# Patient Record
Sex: Female | Born: 1935 | Race: Black or African American | Hispanic: No | Marital: Married | Smoking: Never smoker
Health system: Southern US, Community
[De-identification: ages and names within clinical notes are randomized; demographics above are authoritative.]

## PROBLEM LIST (undated history)

## (undated) DIAGNOSIS — I10 Essential (primary) hypertension: Secondary | ICD-10-CM

---

## 2005-10-26 ENCOUNTER — Emergency Department (HOSPITAL_COMMUNITY): Admission: EM | Admit: 2005-10-26 | Discharge: 2005-10-26 | Payer: Self-pay | Admitting: Emergency Medicine

## 2011-08-29 DIAGNOSIS — E559 Vitamin D deficiency, unspecified: Secondary | ICD-10-CM | POA: Diagnosis not present

## 2011-08-29 DIAGNOSIS — E781 Pure hyperglyceridemia: Secondary | ICD-10-CM | POA: Diagnosis not present

## 2011-08-29 DIAGNOSIS — I10 Essential (primary) hypertension: Secondary | ICD-10-CM | POA: Diagnosis not present

## 2011-12-27 DIAGNOSIS — F0390 Unspecified dementia without behavioral disturbance: Secondary | ICD-10-CM | POA: Diagnosis not present

## 2011-12-27 DIAGNOSIS — I10 Essential (primary) hypertension: Secondary | ICD-10-CM | POA: Diagnosis not present

## 2012-03-27 DIAGNOSIS — I10 Essential (primary) hypertension: Secondary | ICD-10-CM | POA: Diagnosis not present

## 2012-04-19 DIAGNOSIS — I10 Essential (primary) hypertension: Secondary | ICD-10-CM | POA: Diagnosis not present

## 2013-01-09 DIAGNOSIS — I10 Essential (primary) hypertension: Secondary | ICD-10-CM | POA: Diagnosis not present

## 2013-01-09 DIAGNOSIS — E7800 Pure hypercholesterolemia, unspecified: Secondary | ICD-10-CM | POA: Diagnosis not present

## 2013-03-20 DIAGNOSIS — Z0000 Encounter for general adult medical examination without abnormal findings: Secondary | ICD-10-CM | POA: Diagnosis not present

## 2013-07-03 DIAGNOSIS — I10 Essential (primary) hypertension: Secondary | ICD-10-CM | POA: Diagnosis not present

## 2014-04-30 DIAGNOSIS — Z23 Encounter for immunization: Secondary | ICD-10-CM | POA: Diagnosis not present

## 2014-04-30 DIAGNOSIS — L218 Other seborrheic dermatitis: Secondary | ICD-10-CM | POA: Diagnosis not present

## 2014-04-30 DIAGNOSIS — I10 Essential (primary) hypertension: Secondary | ICD-10-CM | POA: Diagnosis not present

## 2014-05-21 ENCOUNTER — Encounter (HOSPITAL_COMMUNITY): Payer: Self-pay | Admitting: Emergency Medicine

## 2014-05-21 ENCOUNTER — Emergency Department (HOSPITAL_COMMUNITY)
Admission: EM | Admit: 2014-05-21 | Discharge: 2014-05-21 | Disposition: A | Discharge status: Home / Self Care | Payer: Self-pay | Attending: Emergency Medicine | Admitting: Emergency Medicine

## 2014-05-21 DIAGNOSIS — Z79899 Other long term (current) drug therapy: Secondary | ICD-10-CM | POA: Insufficient documentation

## 2014-05-21 DIAGNOSIS — B029 Zoster without complications: Secondary | ICD-10-CM | POA: Insufficient documentation

## 2014-05-21 DIAGNOSIS — I10 Essential (primary) hypertension: Secondary | ICD-10-CM | POA: Insufficient documentation

## 2014-05-21 HISTORY — DX: Essential (primary) hypertension: I10

## 2014-05-21 MED ORDER — ACYCLOVIR 400 MG PO TABS: 800.0000 mg | ORAL_TABLET | Freq: Every day | ORAL | Status: DC

## 2014-05-21 NOTE — Discharge Instructions (Signed)
Take acyclovir as directed until gone. Refer to attached documents for more information. Return to the ED with worsening or concerning symptoms.

## 2014-05-21 NOTE — ED Notes (Signed)
Per pt family, pt w/pustular rash on right arm.

## 2014-05-21 NOTE — ED Provider Notes (Signed)
CSN: [id number]     Arrival date & time 05/21/14  1151 History   First MD Initiated Contact with Patient 05/21/14 1159     Chief Complaint  Patient presents with  . Rash     (Consider location/radiation/quality/duration/timing/severity/associated sxs/prior Treatment) HPI Comments: Patient is a 80 year old female with a past medical history of hypertension who presents with right arm rash for the past 2 days. The rash started gradually and progressively worsened since the onset. The rash is located on the right arm. Patient has tried nothing without relief. Patient denies new exposures to medications, soaps, lotions, detergent. Patient reports associated itching. No aggravating/alleviating factors. Patient denies pain. Patient denies fever, chills, NVD, sore throat, oral lesions, ocular involvement, throat closing, wheezing, SOB, chest pain, abdominal pain.       Past Medical History  Diagnosis Date  . Hypertension    History reviewed. No pertinent past surgical history. History reviewed. No pertinent family history. History  Substance Use Topics  . Smoking status: Never Smoker   . Smokeless tobacco: Not on file  . Alcohol Use: No   OB History    No data available     Review of Systems  Constitutional: Negative for fever, chills and fatigue.  HENT: Negative for trouble swallowing.   Eyes: Negative for visual disturbance.  Respiratory: Negative for shortness of breath.   Cardiovascular: Negative for chest pain and palpitations.  Gastrointestinal: Negative for nausea, vomiting, abdominal pain and diarrhea.  Genitourinary: Negative for dysuria and difficulty urinating.  Musculoskeletal: Negative for arthralgias and neck pain.  Skin: Positive for rash. Negative for color change.  Neurological: Negative for dizziness and weakness.  Psychiatric/Behavioral: Negative for dysphoric mood.      Allergies  Review of patient's allergies indicates not on file.  Home Medications    Prior to Admission medications   Medication Sig Start Date End Date Taking? Authorizing Provider  amLODipine (NORVASC) 10 MG tablet Take 10 mg by mouth daily. 05/07/14  Yes Historical Provider, MD  losartan-hydrochlorothiazide (HYZAAR) 100-25 MG per tablet Take 1 tablet by mouth daily. 05/07/14  Yes Historical Provider, MD   BP 175/66 mmHg  Pulse 96  Temp(Src) 99.6 F (37.6 C)  Resp 14  Ht 5' (1.524 m)  Wt 135 lb (61.236 kg)  BMI 26.37 kg/m2  SpO2 100% Physical Exam  Constitutional: She appears well-developed and well-nourished. No distress.  HENT:  Head: Normocephalic and atraumatic.  Eyes: Conjunctivae and EOM are normal.  Neck: Normal range of motion.  Cardiovascular: Normal rate and regular rhythm.  Exam reveals no gallop and no friction rub.   No murmur heard. Pulmonary/Chest: Effort normal and breath sounds normal. She has no wheezes. She has no rales. She exhibits no tenderness.  Abdominal: Soft. She exhibits no distension. There is no tenderness. There is no rebound.  Musculoskeletal: Normal range of motion.  Neurological: She is alert.  Speech is goal-oriented. Moves limbs without ataxia.   Skin: Skin is warm and dry.  Pruritic, vesicular rash on erythematous base in a C5-T1 dermatomal pattern of right arm. There are associated excoriations and scant amount of petechiae.   Psychiatric: She has a normal mood and affect. Her behavior is normal.  Nursing note and vitals reviewed.   ED Course  Procedures (including critical care time) Labs Review Labs Reviewed - No data to display  Imaging Review No results found.   EKG Interpretation None      MDM   Final diagnoses:  Shingles  12:22 PM Patient with a pruritic, vesicular rash on erythematous base that follows a dermatomal pattern of right arm. Patient has a low grade temp of 99.6 with remaining vitals stable.   12:37 PM Patient will be treated for shingles with acyclovir. Patient instructed to return  with worsening or concerning symptoms.   Emilia BeckKaitlyn Ellamarie Naeve, PA-C 05/21/14 1241  Mirian MoMatthew Gentry, MD 05/21/14 1415

## 2015-07-05 DIAGNOSIS — R112 Nausea with vomiting, unspecified: Secondary | ICD-10-CM | POA: Diagnosis not present

## 2015-07-09 DIAGNOSIS — L218 Other seborrheic dermatitis: Secondary | ICD-10-CM | POA: Diagnosis not present

## 2015-07-09 DIAGNOSIS — Z6823 Body mass index (BMI) 23.0-23.9, adult: Secondary | ICD-10-CM | POA: Diagnosis not present

## 2015-07-09 DIAGNOSIS — I10 Essential (primary) hypertension: Secondary | ICD-10-CM | POA: Diagnosis not present

## 2015-07-09 DIAGNOSIS — J399 Disease of upper respiratory tract, unspecified: Secondary | ICD-10-CM | POA: Diagnosis not present

## 2015-10-07 DIAGNOSIS — L218 Other seborrheic dermatitis: Secondary | ICD-10-CM | POA: Diagnosis not present

## 2015-10-07 DIAGNOSIS — I10 Essential (primary) hypertension: Secondary | ICD-10-CM | POA: Diagnosis not present

## 2016-02-17 DIAGNOSIS — I10 Essential (primary) hypertension: Secondary | ICD-10-CM | POA: Diagnosis not present

## 2016-02-17 DIAGNOSIS — R634 Abnormal weight loss: Secondary | ICD-10-CM | POA: Diagnosis not present

## 2016-02-17 DIAGNOSIS — R21 Rash and other nonspecific skin eruption: Secondary | ICD-10-CM | POA: Diagnosis not present

## 2016-04-05 DIAGNOSIS — I10 Essential (primary) hypertension: Secondary | ICD-10-CM | POA: Diagnosis not present

## 2016-05-31 DIAGNOSIS — Z23 Encounter for immunization: Secondary | ICD-10-CM | POA: Diagnosis not present

## 2016-05-31 DIAGNOSIS — I10 Essential (primary) hypertension: Secondary | ICD-10-CM | POA: Diagnosis not present

## 2016-05-31 DIAGNOSIS — Z6822 Body mass index (BMI) 22.0-22.9, adult: Secondary | ICD-10-CM | POA: Diagnosis not present

## 2016-08-17 DIAGNOSIS — J111 Influenza due to unidentified influenza virus with other respiratory manifestations: Secondary | ICD-10-CM | POA: Diagnosis not present

## 2016-08-17 DIAGNOSIS — J189 Pneumonia, unspecified organism: Secondary | ICD-10-CM | POA: Diagnosis not present

## 2016-08-22 ENCOUNTER — Inpatient Hospital Stay (HOSPITAL_COMMUNITY)
Admission: EM | Admit: 2016-08-22 | Discharge: 2016-08-25 | DRG: 871 | Disposition: A | Discharge status: Home Health | Payer: Medicare Other | Attending: Internal Medicine | Admitting: Internal Medicine

## 2016-08-22 ENCOUNTER — Emergency Department (HOSPITAL_COMMUNITY): Payer: Medicare Other

## 2016-08-22 ENCOUNTER — Encounter (HOSPITAL_COMMUNITY): Payer: Self-pay | Admitting: Family Medicine

## 2016-08-22 DIAGNOSIS — N183 Chronic kidney disease, stage 3 unspecified: Secondary | ICD-10-CM | POA: Diagnosis present

## 2016-08-22 DIAGNOSIS — A419 Sepsis, unspecified organism: Secondary | ICD-10-CM | POA: Diagnosis not present

## 2016-08-22 DIAGNOSIS — N179 Acute kidney failure, unspecified: Secondary | ICD-10-CM | POA: Diagnosis present

## 2016-08-22 DIAGNOSIS — Z79899 Other long term (current) drug therapy: Secondary | ICD-10-CM | POA: Diagnosis not present

## 2016-08-22 DIAGNOSIS — I10 Essential (primary) hypertension: Secondary | ICD-10-CM | POA: Diagnosis not present

## 2016-08-22 DIAGNOSIS — R069 Unspecified abnormalities of breathing: Secondary | ICD-10-CM | POA: Diagnosis not present

## 2016-08-22 DIAGNOSIS — J189 Pneumonia, unspecified organism: Secondary | ICD-10-CM | POA: Diagnosis not present

## 2016-08-22 DIAGNOSIS — R531 Weakness: Secondary | ICD-10-CM | POA: Diagnosis not present

## 2016-08-22 DIAGNOSIS — I129 Hypertensive chronic kidney disease with stage 1 through stage 4 chronic kidney disease, or unspecified chronic kidney disease: Secondary | ICD-10-CM | POA: Diagnosis present

## 2016-08-22 DIAGNOSIS — E876 Hypokalemia: Secondary | ICD-10-CM | POA: Diagnosis present

## 2016-08-22 DIAGNOSIS — R05 Cough: Secondary | ICD-10-CM | POA: Diagnosis not present

## 2016-08-22 LAB — I-STAT TROPONIN, ED: Troponin i, poc: 0.03 ng/mL (ref 0.00–0.08)

## 2016-08-22 LAB — URINALYSIS, ROUTINE W REFLEX MICROSCOPIC
Bilirubin Urine: NEGATIVE
Glucose, UA: NEGATIVE mg/dL
Ketones, ur: NEGATIVE mg/dL
Leukocytes, UA: NEGATIVE
Nitrite: NEGATIVE
Protein, ur: NEGATIVE mg/dL
Specific Gravity, Urine: 1.009 (ref 1.005–1.030)
pH: 6 (ref 5.0–8.0)

## 2016-08-22 LAB — BASIC METABOLIC PANEL
Anion gap: 13 (ref 5–15)
BUN: 25 mg/dL — ABNORMAL HIGH (ref 6–20)
CO2: 29 mmol/L (ref 22–32)
CREATININE: 1.11 mg/dL — AB (ref 0.44–1.00)
Calcium: 8.7 mg/dL — ABNORMAL LOW (ref 8.9–10.3)
Chloride: 94 mmol/L — ABNORMAL LOW (ref 101–111)
GFR calc Af Amer: 48 mL/min — ABNORMAL LOW (ref >60)
GFR calc non Af Amer: 41 mL/min — ABNORMAL LOW (ref >60)
Glucose, Bld: 182 mg/dL — ABNORMAL HIGH (ref 65–99)
Potassium: 3.3 mmol/L — ABNORMAL LOW (ref 3.5–5.1)
Sodium: 136 mmol/L (ref 135–145)

## 2016-08-22 LAB — I-STAT CG4 LACTIC ACID, ED: LACTIC ACID, VENOUS: 2.43 mmol/L — AB (ref 0.5–1.9)

## 2016-08-22 LAB — CBC
HCT: 44.7 % (ref 36.0–46.0)
Hemoglobin: 14.1 g/dL (ref 12.0–15.0)
MCH: 27.5 pg (ref 26.0–34.0)
MCHC: 31.5 g/dL (ref 30.0–36.0)
MCV: 87.1 fL (ref 78.0–100.0)
PLATELETS: 432 10*3/uL — AB (ref 150–400)
RBC: 5.13 MIL/uL — ABNORMAL HIGH (ref 3.87–5.11)
RDW: 13.6 % (ref 11.5–15.5)
WBC: 10.7 10*3/uL — ABNORMAL HIGH (ref 4.0–10.5)

## 2016-08-22 MED ORDER — AZITHROMYCIN 500 MG PO TABS
500.0000 mg | ORAL_TABLET | ORAL | Status: DC
  Administered 2016-08-23 – 2016-08-24 (×2): 500 mg via ORAL
  Filled 2016-08-22 (×2): qty 1

## 2016-08-22 MED ORDER — ACETAMINOPHEN 325 MG PO TABS: 650.0000 mg | ORAL_TABLET | Freq: Four times a day (QID) | ORAL | Status: DC | PRN

## 2016-08-22 MED ORDER — SODIUM CHLORIDE 0.9 % IV BOLUS (SEPSIS)
1000.0000 mL | Freq: Once | INTRAVENOUS | Status: AC
  Administered 2016-08-22: 1000 mL via INTRAVENOUS

## 2016-08-22 MED ORDER — SODIUM CHLORIDE 0.9 % IV SOLN
INTRAVENOUS | Status: AC
  Administered 2016-08-22: 23:00:00 via INTRAVENOUS

## 2016-08-22 MED ORDER — POTASSIUM CHLORIDE CRYS ER 20 MEQ PO TBCR
40.0000 meq | EXTENDED_RELEASE_TABLET | Freq: Once | ORAL | Status: AC
  Administered 2016-08-22: 40 meq via ORAL
  Filled 2016-08-22: qty 2

## 2016-08-22 MED ORDER — ONDANSETRON HCL 4 MG/2ML IJ SOLN: 4.0000 mg | Freq: Four times a day (QID) | INTRAMUSCULAR | Status: DC | PRN

## 2016-08-22 MED ORDER — DEXTROSE 5 % IV SOLN
1.0000 g | Freq: Once | INTRAVENOUS | Status: AC
  Administered 2016-08-22: 1 g via INTRAVENOUS
  Filled 2016-08-22: qty 10

## 2016-08-22 MED ORDER — SODIUM CHLORIDE 0.9 % IV BOLUS (SEPSIS)
500.0000 mL | Freq: Once | INTRAVENOUS | Status: AC
  Administered 2016-08-22: 500 mL via INTRAVENOUS

## 2016-08-22 MED ORDER — ACETAMINOPHEN 650 MG RE SUPP: 650.0000 mg | Freq: Four times a day (QID) | RECTAL | Status: DC | PRN

## 2016-08-22 MED ORDER — DEXTROSE 5 % IV SOLN
500.0000 mg | Freq: Once | INTRAVENOUS | Status: AC
  Administered 2016-08-22: 500 mg via INTRAVENOUS
  Filled 2016-08-22: qty 500

## 2016-08-22 MED ORDER — ENOXAPARIN SODIUM 30 MG/0.3ML ~~LOC~~ SOLN
30.0000 mg | SUBCUTANEOUS | Status: DC
  Administered 2016-08-23 – 2016-08-25 (×3): 30 mg via SUBCUTANEOUS
  Filled 2016-08-22 (×3): qty 0.3

## 2016-08-22 MED ORDER — ONDANSETRON HCL 4 MG PO TABS: 4.0000 mg | ORAL_TABLET | Freq: Four times a day (QID) | ORAL | Status: DC | PRN

## 2016-08-22 MED ORDER — DEXTROSE 5 % IV SOLN
1.0000 g | INTRAVENOUS | Status: DC
  Administered 2016-08-23 – 2016-08-24 (×2): 1 g via INTRAVENOUS
  Filled 2016-08-22 (×2): qty 10

## 2016-08-22 NOTE — ED Notes (Signed)
Inquired about delay in blood culture draw. Pt updated.

## 2016-08-22 NOTE — H&P (Signed)
History and Physical  Patient Name: Abigail Patel     ZOX:[id number]    DOB: 1921-05-19    DOA: 08/22/2016 PCP: [name], MD   Patient coming from: Home via EMS  Chief Complaint: Weakness  HPI: Abigail Patel is a 83 y.o. female with a past medical history significant for HTN who presents with cough and weakness progressive over a week after influenza.  The patient was in her usual state of health until early last week, Tuesday, when her son took her to the doctor for cough. She tested positive for influenza, was prescribed Tamiflu, which she took as prescribed and finished the course.  However, during that time, her son noticed that she was getting weaker and weaker, more tired, eating very little, asking her to go to bed all the time, and continued to cough. Today she needed help walking, going to the bathroom, and standing, so he called 9-1-1.  ED course: -Temp 99.24F, heart rate 95, respirations 22, blood pressure 110/66, pulse oximetry 92% on room air -Na 136, K 3.3, Cr 1.11 (baseline unknown), WBC 10.7K, Hgb 14.1 -BUN 25, troponin negative -UA showed no pyuria or hematuria -Lactic acid 2.43 -Chest x-ray showed a patchy multilobar right pneumonia -Blood cultures were obtained, 1.5 L normal saline was administered, and ceftriaxone and azithromycin were given for community-acquired pneumonia with mild sepsis and TRH were asked to evaluate          ROS: Review of Systems  Constitutional: Positive for malaise/fatigue. Negative for chills and fever (last week).  Respiratory: Positive for cough and sputum production (brown).   Neurological: Positive for weakness.  Psychiatric/Behavioral: Positive for memory loss (seemed altered to family).  All other systems reviewed and are negative.         Past Medical History:  Diagnosis Date  . Hypertension     History reviewed. No pertinent surgical history.  Social History: Patient lives with her son.  The patient walks  with a cane.  She walks daily for exercise.  At baseline she has no dementia per family, would be aware of all surroundings and date/time, and is independent with all ADLs.  Minimal remote smoking history.  From [place] area originally. 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  Has five children, no HCPOA.  No Known Allergies  Family history: family history includes Diabetes in her other; Hypertension in her other.  Prior to Admission medications   Medication Sig Start Date End Date Taking? Authorizing Provider  amLODipine (NORVASC) 10 MG tablet Take 10 mg by mouth daily. 05/07/14  Yes Historical Provider, MD  [facility] COUGH DM 30 MG/5ML liquid Take 5 mLs by mouth daily as needed for cough. 08/17/16  Yes Historical Provider, MD  losartan-hydrochlorothiazide (HYZAAR) 100-25 MG per tablet Take 1 tablet by mouth daily. 05/07/14  Yes Historical Provider, MD       Physical Exam: BP (!) 101/52   Pulse 87   Temp 99.8 F (37.7 C) (Rectal)   Resp 21   Ht 5\' 5"  (1.651 m)   Wt 48.5 kg (107 lb)   SpO2 96%   BMI 17.81 kg/m  General appearance: Well-developed, elderly adult female, awake, but slowed, appears confused, slightly tachypneic but in no great distress.   Eyes: Anicteric, conjunctiva pink, lids and lashes normal. PERRL.    ENT: No nasal deformity, discharge, epistaxis.  Hearing seems mostly normal. OP tacky dry without lesions.   Neck: No neck masses.  Trachea midline.  No thyromegaly/tenderness.  Lymph: No cervical or supraclavicular lymphadenopathy. Skin: Warm and dry.  No suspicious rashes or lesions. Cardiac: Tachycardic, regular, nl S1-S2, no murmurs appreciated.  Capillary refill is brisk.  JVP normal.  No LE edema.  Radial and DP pulses 2+ and symmetric. Respiratory: Tachypneic, unmistakable rales in right upper and lower lobes, diminished on left. Abdomen: Abdomen soft.  No TTP. No ascites, distension, hepatosplenomegaly.   MSK: No deformities or effusions.  No  cyanosis or clubbing. Neuro: Cranial nerves grossly normal.  Sensation intact to light touch. Speech is fluent.  Muscle strength 5-/5 and symmetric in upper and lower.    Psych: Sensorium intact and responding to questions, but slowed, not oriented to "[facility]" or "[place]" or year.  Knows she is in a bed, maybe oriented to hospital.  Attention diminished.       Labs on Admission:  I have personally reviewed following labs and imaging studies: CBC:  Recent Labs Lab 08/22/16 1552  WBC 10.7*  HGB 14.1  HCT 44.7  MCV 87.1  PLT 432*   Basic Metabolic Panel:  Recent Labs Lab 08/22/16 1552  NA 136  K 3.3*  CL 94*  CO2 29  GLUCOSE 182*  BUN 25*  CREATININE 1.11*  CALCIUM 8.7*   GFR: Estimated Creatinine Clearance: 23.7 mL/min (by C-G formula based on SCr of 1.11 mg/dL (H)).  Liver Function Tests: No results for input(s): AST, ALT, ALKPHOS, BILITOT, PROT, ALBUMIN in the last 168 hours. No results for input(s): LIPASE, AMYLASE in the last 168 hours. No results for input(s): AMMONIA in the last 168 hours. Coagulation Profile: No results for input(s): INR, PROTIME in the last 168 hours. Cardiac Enzymes: No results for input(s): CKTOTAL, CKMB, CKMBINDEX, TROPONINI in the last 168 hours. BNP (last 3 results) No results for input(s): PROBNP in the last 8760 hours. HbA1C: No results for input(s): HGBA1C in the last 72 hours. CBG: No results for input(s): GLUCAP in the last 168 hours. Lipid Profile: No results for input(s): CHOL, HDL, LDLCALC, TRIG, CHOLHDL, LDLDIRECT in the last 72 hours. Thyroid Function Tests: No results for input(s): TSH, T4TOTAL, FREET4, T3FREE, THYROIDAB in the last 72 hours. Anemia Panel: No results for input(s): VITAMINB12, FOLATE, FERRITIN, TIBC, IRON, RETICCTPCT in the last 72 hours. Sepsis Labs: Lactic acid 2.43 Invalid input(s): PROCALCITONIN, LACTICIDVEN No results found for this or any previous visit (from the past 240 hour(s)).        Radiological Exams on Admission: Personally reviewed CXR showed patchy right sided opacities in probably upper, middle and lower lobes: Dg Chest 2 View  Result Date: 08/22/2016 CLINICAL DATA:  83 year old female with cough for 1 week and abnormal pulmonary auscultation. Initial encounter. EXAM: CHEST  2 VIEW COMPARISON:  None. FINDINGS: Semi upright AP and lateral views of the chest. Abnormal patchy pulmonary opacity in the right upper and lower lung on the AP view. This is probably upper lobe and middle lobe involvement. On the lateral view there is associated abnormal posterior lower lobe opacity. No definite pleural effusion. No pneumothorax or pulmonary edema. Calcified aortic atherosclerosis. Mediastinal contours are within normal limits. Visualized tracheal air column is within normal limits. Osteopenia. No acute osseous abnormality identified. Negative visible bowel gas pattern. IMPRESSION: Patchy multilobar opacity on the right is nonspecific but suspicious for pneumonia. No definite pleural effusion. If acute infection is suspected then follow-up PA and lateral chest X-ray is recommended in 3-4 weeks following trial of antibiotic therapy to ensure resolution and exclude underlying malignancy. Electronically Signed  By: Odessa Fleming M.D.   On: 08/22/2016 16:18    EKG: Independently reviewed. Rate 92, QTc 454, PACs, RVH possible.    Assessment/Plan  1. Early/mild sepsis from pneumonia:  Suspected source pneumonia. Organism unknown.   Patient meets criteria given tachycardia, tachypnea, and evidence of organ dysfunction.  Lactate 2.43 mmol/L and repeat ordered within 6 hours.  This patient is maybe at high risk of poor outcomes with a qSOFA score of 2.  Antibiotics delivered in the ED.    -Sepsis bundle utilized:  -Blood and urine cultures drawn  -Antibiotics: ceftriaxone and azithromycin   -Recently completed Tamiflu  -Repeat renal function and complete blood count in AM  -Sputum  culture requested  -Chest PT and IS  -IVF overnight      2. Hypokalemia:  Mild. -Oral K suppl  3. Elevated creatinine, CKD III versus mild AKI:  Unknown baseline.  Family unaware of CKD if patient has it. -Hold ARB -IVF and trend Cr  4. Hypertension:  -Hold ARB given ?AKI -Hold amlodipine and HCTZ until hemodynamics clearer           DVT prophylaxis: Lovenxo  Code Status: FULL  Family Communication: Daughter and son at bedside, CODE STATUS confirmed 2 family members present very clear, patient has no POA Disposition Plan: Anticipate IV antibitoics and fluids and trend electrolytes and cultures Consults called: None Admission status: INPATIENT    Medical decision making: Patient seen at 8:16 PM on 08/22/2016.  The patient was discussed with Dr. Clarene Duke.  What exists of the patient's chart was reviewed in depth and summarized above.  Clinical condition: stable at this time from hemodynamic and respiratory standpoint.        Alberteen Sam [facility] Pager [phone]       At the time of admission, it appears that the appropriate admission status for this patient is INPATIENT. This is judged to be reasonable and necessary in order to provide the required intensity of service to ensure the patient's safety given the presenting symptoms, physical exam findings, and initial radiographic and laboratory data in the context of their chronic comorbidities.  Together, these circumstances are felt to place her at high risk for further clinical deterioration threatening life, limb, or organ.   Patient requires inpatient status due to high intensity of service, high risk for further deterioration and high frequency of surveillance required because of this acute illness that poses a threat to life, limb or bodily function.  Factors that support inpatient status include: Pneumonia with PSI score 104-114, largely due to very advanced age Elevated creatinine, unclear  chronicity Electroylte abnormality Altered mental status from baseline  I certify that at the point of admission it is my clinical judgment that the patient will require inpatient hospital care spanning beyond 2 midnights from the point of admission and that early discharge would result in unnecessary risk of decompensation and readmission or threat to life, limb or bodily function.

## 2016-08-22 NOTE — ED Notes (Signed)
Phlebotomy at bedside.

## 2016-08-22 NOTE — ED Triage Notes (Signed)
PT presents from home via [facility] with c/o generalize weakness & fatigue since Sunday - she was diagnosed with the Flu last week and completed Rx for Tamiflu on Sunday. Pt is A&Ox4 and denies pain. Has congested cough and crackles. Pt is accompanied by her son whom she lives with.

## 2016-08-22 NOTE — ED Notes (Signed)
Pt transported to Xray. 

## 2016-08-22 NOTE — ED Provider Notes (Signed)
[facility]-EMERGENCY DEPT Provider Note   CSN: [id number] Arrival date & time: 08/22/16 1533     History    Chief Complaint  Patient presents with  . Weakness     HPI Abigail Patel is a 83 y.o. female.  83yo F w/ PMH including HTN who p/w Weakness and malaise. The patient developed a cough and viral syndrome last week and was diagnosed with influenza. She finished a course of Tamiflu yesterday. Family reports that she has had ongoing generalized weakness and fatigue for the past several days with difficulty getting out of bed. She reports eating and drinking okay with no urinary symptoms, vomiting, diarrhea, chest pain, or shortness of breath. She does endorse ongoing cough. She is not aware of any recent fevers. Family reports that she has had occasional confusion that waxes and wanes but is not sustained.   Past Medical History:  Diagnosis Date  . Hypertension      There are no active problems to display for this patient.   History reviewed. No pertinent surgical history.  OB History    No data available        Home Medications    Prior to Admission medications   Medication Sig Start Date End Date Taking? Authorizing Provider  acyclovir (ZOVIRAX) 400 MG tablet Take 2 tablets (800 mg total) by mouth 5 (five) times daily. 05/21/14   Kaitlyn Szekalski, PA-C  amLODipine (NORVASC) 10 MG tablet Take 10 mg by mouth daily. 05/07/14   Historical Provider, MD  losartan-hydrochlorothiazide (HYZAAR) 100-25 MG per tablet Take 1 tablet by mouth daily. 05/07/14   Historical Provider, MD      No family history on file.   Social History  Substance Use Topics  . Smoking status: Never Smoker  . Smokeless tobacco: Not on file  . Alcohol use No     Allergies     Patient has no known allergies.    Review of Systems  10 Systems reviewed and are negative for acute change except as noted in the HPI.   Physical Exam Updated Vital Signs BP 110/66 (BP Location: Left Arm)    Pulse 95   Temp 99.8 F (37.7 C) (Rectal)   Resp 22   Ht 5\' 5"  (1.651 m)   Wt 107 lb (48.5 kg)   SpO2 92%   BMI 17.81 kg/m   Physical Exam  Constitutional: She is oriented to person, place, and time. She appears well-developed. No distress.  Frail, elderly woman awake and alert  HENT:  Head: Normocephalic and atraumatic.  dry mucous membranes  Eyes: Conjunctivae are normal. Pupils are equal, round, and reactive to light.  Neck: Neck supple.  Cardiovascular: Normal rate, regular rhythm and normal heart sounds.   No murmur heard. Pulmonary/Chest: Effort normal.  Rhonchi b/l, poor inspiratory effort  Abdominal: Soft. Bowel sounds are normal. She exhibits no distension. There is no tenderness.  Musculoskeletal: She exhibits no edema.  Neurological: She is alert and oriented to person, place, and time.  Fluent speech  Skin: Skin is warm and dry.  Psychiatric: She has a normal mood and affect. Judgment normal.  Nursing note and vitals reviewed.     ED Treatments / Results  Labs (all labs ordered are listed, but only abnormal results are displayed) Labs Reviewed  BASIC METABOLIC PANEL - Abnormal; Notable for the following:       Result Value   Potassium 3.3 (*)    Chloride 94 (*)    Glucose, Bld 182 (*)  BUN 25 (*)    Creatinine, Ser 1.11 (*)    Calcium 8.7 (*)    GFR calc non Af Amer 41 (*)    GFR calc Af Amer 48 (*)    All other components within normal limits  CBC - Abnormal; Notable for the following:    WBC 10.7 (*)    RBC 5.13 (*)    Platelets 432 (*)    All other components within normal limits  URINALYSIS, ROUTINE W REFLEX MICROSCOPIC - Abnormal; Notable for the following:    Hgb urine dipstick SMALL (*)    Bacteria, UA RARE (*)    Squamous Epithelial / LPF 0-5 (*)    All other components within normal limits  I-STAT CG4 LACTIC ACID, ED - Abnormal; Notable for the following:    Lactic Acid, Venous 2.43 (*)    All other components within normal limits    URINE CULTURE  CULTURE, BLOOD (ROUTINE X 2)  CULTURE, BLOOD (ROUTINE X 2)  I-STAT TROPOININ, ED     EKG  EKG Interpretation  Date/Time:  Tuesday August 22 2016 15:45:12 EST Ventricular Rate:  92 PR Interval:    QRS Duration: 83 QT Interval:  367 QTC Calculation: 454 R Axis:   -131 Text Interpretation:  Sinus rhythm Atrial premature complexes S1,S2,S3 pattern No previous ECGs available Confirmed by Kaveh Kissinger MD, Viral Schramm [phone]) on 08/22/2016 4:45:51 PM         Radiology Dg Chest 2 View  Result Date: 08/22/2016 CLINICAL DATA:  83 year old female with cough for 1 week and abnormal pulmonary auscultation. Initial encounter. EXAM: CHEST  2 VIEW COMPARISON:  None. FINDINGS: Semi upright AP and lateral views of the chest. Abnormal patchy pulmonary opacity in the right upper and lower lung on the AP view. This is probably upper lobe and middle lobe involvement. On the lateral view there is associated abnormal posterior lower lobe opacity. No definite pleural effusion. No pneumothorax or pulmonary edema. Calcified aortic atherosclerosis. Mediastinal contours are within normal limits. Visualized tracheal air column is within normal limits. Osteopenia. No acute osseous abnormality identified. Negative visible bowel gas pattern. IMPRESSION: Patchy multilobar opacity on the right is nonspecific but suspicious for pneumonia. No definite pleural effusion. If acute infection is suspected then follow-up PA and lateral chest X-ray is recommended in 3-4 weeks following trial of antibiotic therapy to ensure resolution and exclude underlying malignancy. Electronically Signed   By: Odessa Fleming M.D.   On: 08/22/2016 16:18    Procedures Procedures (including critical care time) Procedures  Medications Ordered in ED  Medications  cefTRIAXone (ROCEPHIN) 1 g in dextrose 5 % 50 mL IVPB (not administered)  azithromycin (ZITHROMAX) 500 mg in dextrose 5 % 250 mL IVPB (not administered)  sodium chloride 0.9 %  bolus 1,000 mL (1,000 mLs Intravenous New Bag/Given 08/22/16 1658)  sodium chloride 0.9 % bolus 500 mL (500 mLs Intravenous New Bag/Given 08/22/16 1658)     Initial Impression / Assessment and Plan / ED Course  I have reviewed the triage vital signs and the nursing notes.  Pertinent labs & imaging results that were available during my care of the patient were reviewed by me and considered in my medical decision making (see chart for details).    PT w/ recent flu-like illness just finished tamiflu brought in by family for ongoing fatigue and generalized weakness. She was nontoxic on exam, mildly dry but no acute distress. O2 saturation 92-95% on room air. No respiratory distress. She did have rhonchi bilaterally.  Labwork shows initial lactate of 2.43, neg UA, BUN 25, creatinine 1.11, WBC 10.7. Chest x-ray does show right-sided pneumonia which I expected given her symptoms. Gave 1.5L IV fluids as well as ceftriaxone and azithromycin. She has mild hypoxia requiring 1L O2 and given age, I recommended admission. Discussed with hospitalist, Dr. Maryfrances Bunnellanford, and pt admitted for further treatment.  Final Clinical Impressions(s) / ED Diagnoses   Final diagnoses:  None     New Prescriptions   No medications on file       Laurence Spatesachel Morgan Saylee Sherrill, MD 08/23/16 [phone]

## 2016-08-23 DIAGNOSIS — J189 Pneumonia, unspecified organism: Secondary | ICD-10-CM

## 2016-08-23 DIAGNOSIS — I10 Essential (primary) hypertension: Secondary | ICD-10-CM

## 2016-08-23 DIAGNOSIS — E876 Hypokalemia: Secondary | ICD-10-CM

## 2016-08-23 DIAGNOSIS — N179 Acute kidney failure, unspecified: Secondary | ICD-10-CM

## 2016-08-23 LAB — BASIC METABOLIC PANEL
ANION GAP: 7 (ref 5–15)
BUN: 16 mg/dL (ref 6–20)
CO2: 31 mmol/L (ref 22–32)
Calcium: 8.2 mg/dL — ABNORMAL LOW (ref 8.9–10.3)
Chloride: 102 mmol/L (ref 101–111)
Creatinine, Ser: 1 mg/dL (ref 0.44–1.00)
GFR, EST AFRICAN AMERICAN: 54 mL/min — AB (ref >60)
GFR, EST NON AFRICAN AMERICAN: 47 mL/min — AB (ref >60)
GLUCOSE: 144 mg/dL — AB (ref 65–99)
POTASSIUM: 3 mmol/L — AB (ref 3.5–5.1)
Sodium: 140 mmol/L (ref 135–145)

## 2016-08-23 LAB — LACTIC ACID, PLASMA: LACTIC ACID, VENOUS: 1.4 mmol/L (ref 0.5–1.9)

## 2016-08-23 LAB — CBC
HEMATOCRIT: 38.6 % (ref 36.0–46.0)
HEMOGLOBIN: 12.2 g/dL (ref 12.0–15.0)
MCH: 27.4 pg (ref 26.0–34.0)
MCHC: 31.6 g/dL (ref 30.0–36.0)
MCV: 86.5 fL (ref 78.0–100.0)
Platelets: 418 10*3/uL — ABNORMAL HIGH (ref 150–400)
RBC: 4.46 MIL/uL (ref 3.87–5.11)
RDW: 13.5 % (ref 11.5–15.5)
WBC: 11.3 10*3/uL — ABNORMAL HIGH (ref 4.0–10.5)

## 2016-08-23 LAB — URINE CULTURE
CULTURE: NO GROWTH
Special Requests: NORMAL

## 2016-08-23 LAB — STREP PNEUMONIAE URINARY ANTIGEN: Strep Pneumo Urinary Antigen: NEGATIVE

## 2016-08-23 MED ORDER — POTASSIUM CHLORIDE 20 MEQ PO PACK
40.0000 meq | PACK | ORAL | Status: AC
  Administered 2016-08-23: 40 meq via ORAL
  Filled 2016-08-23 (×2): qty 2

## 2016-08-23 NOTE — Progress Notes (Signed)
Potassium 3.0, Dr. Ella JubileeArrien notified.

## 2016-08-23 NOTE — Consult Note (Signed)
[facility] Primary Care Navigator  [birth year]  Abigail Patel [birth year] [id number]  Met with patient and daughter Abigail Patel) at the bedside to identify possible discharge needs. Daughter reports that patient had been progressively getting weak and coughing after being treated for flu which had led to this admission.  Patient endorses Dr. Lucianne Lei with [facility] as her primary care provider.    Patient shared using [facility] to obtain medications without any problem.   Patient lives with son Abigail Patel) who manages her medications at home. Daughter assists with ordering and picking up medications from the pharmacy as stated.   Daughter provides transportation to her doctors' appointments.  Patient's sons and daughter are her primary caregivers at home.   Anticipated discharge plan is home with family support per daughter.  Patient and daughter voiced understanding to call primary care provider's office when she gets back home, for a post discharge follow-up appointment within a week or sooner if needs arise. Patient letter (with PCP's contact number) was provided as their reminder.  Discussed with patient and daughter regarding [facility] care management services available for health management and opted to receive[facility] calls to help manage pneumonia at home.  Referral was made for [facility] Pneumoniacalls after discharge. .  For additional questions please contact:  Jordis Repetto A. Dreshaun Stene, BSN, RN-BC [facility] PRIMARY CARE Navigator Cell: [phone]

## 2016-08-23 NOTE — Progress Notes (Signed)
Pt arrived to 5w14, alert oriented to person and place, VS collected. Pt oriented to room and equipment, instructed to call for assistance and call light within reach. No signs of acute distress, unable to obtain admission history due to pt being tired.  Will continue to monitor pt.

## 2016-08-23 NOTE — Progress Notes (Signed)
PROGRESS NOTE    Abigail Patel  ZOX:[id number] DOB: 1921-03-15 DOA: 08/22/2016 PCP: [name], MD    Brief Narrative:  83 yo female with CKD stage 3, who presents with the chief complain of weakness. About 7 days before admission, patient was diagnosed with influenza, Tamiflu was prescribed. Over the course of the next 7 days, patient was noted to have worsening weakness and somnolence, along with persistent cough. On the initial examination her pulse oxymetry was 92%. She was had tachypnea and her lungs had diffuse bilateral rales. Chest film with multilobar pneumonia. Admitted for IV antibiotic therapy and oxymetry monitoring.     Assessment & Plan:   Principal Problem:   Community acquired pneumonia of right lung Active Problems:   Essential hypertension   Hypokalemia   CKD (chronic kidney disease), stage III   1. Community acquired pneumonia, post influenza. Will continue antibiotic therapy with azithromycin and ceftriaxone, follow on blood cultures and cell count. Chest film personally reviewed noted right upper and lower lobe infiltrates.   2. Sepsis due to pneumonia. Will continue gentle hydration with dextrose and saline, will follow a restrictive IV fluids approach to avoid volume overload. EKG personally reviewed noted sinus rhythm.   3. Hypokalemia. K at 3.0 with cr down to 1,0, will continue correction with kcl, follow on renal panel in am.  4. AKI. Renal functin with cr at 1.0 from 1,1 will continue gentle hydration and follow on renal function in am. Avoid hypotension or nephrotoxic medications.   5. HTN. Will continue blood pressure monitoring. Holding amlodipine, losartan and hctz, to prevent hypotension or worsening renal function.   DVT prophylaxis: enoxaparin  Code Status: Full Family Communication: I spoke with patient's son at the bedside and all questions were addressed. Disposition Plan: Home   Consultants:     Procedures:    Antimicrobials:     Ceftriaxone  Azithromycin   Subjective: Patient with persistent cough and dyspnea, no chest pain, no nausea or vomiting. At home ambulating with no difficulty.   Objective: Vitals:   08/22/16 2100 08/22/16 2305 08/23/16 0511 08/23/16 1424  BP: 143/78 (!) 130/51 (!) 125/48 129/62  Pulse: 92 89 77 88  Resp: (!) 27 18 17 15   Temp:  99.3 F (37.4 C) 99.9 F (37.7 C) 98.9 F (37.2 C)  TempSrc:  Oral Oral Oral  SpO2: 92% 92% 93% 96%  Weight:      Height:        Intake/Output Summary (Last 24 hours) at 08/23/16 1528 Last data filed at 08/23/16 0752  Gross per 24 hour  Intake           658.33 ml  Output             1325 ml  Net          -666.67 ml   Filed Weights   08/22/16 1540  Weight: 48.5 kg (107 lb)    Examination:  General exam: deconditioned and ill looking appearing E ENT: positive pallor, no icterus.  Respiratory system: Decreased breath sounds at bases with bibasilar rales. No wheezing, rales or rhonchi.  Cardiovascular system: S1 & S2 heard, RRR. No JVD, murmurs, rubs, gallops or clicks. No pedal edema. Gastrointestinal system: Abdomen is nondistended, soft and nontender. No organomegaly or masses felt. Normal bowel sounds heard. Central nervous system: Alert and oriented. No focal neurological deficits. Extremities: Symmetric 5 x 5 power. Skin: No rashes, lesions or ulcers  Data Reviewed: I have personally reviewed following labs and  imaging studies  CBC:  Recent Labs Lab 08/22/16 1552 08/23/16 0003  WBC 10.7* 11.3*  HGB 14.1 12.2  HCT 44.7 38.6  MCV 87.1 86.5  PLT 432* 418*   Basic Metabolic Panel:  Recent Labs Lab 08/22/16 1552 08/23/16 0003  NA 136 140  K 3.3* 3.0*  CL 94* 102  CO2 29 31  GLUCOSE 182* 144*  BUN 25* 16  CREATININE 1.11* 1.00  CALCIUM 8.7* 8.2*   GFR: Estimated Creatinine Clearance: 26.3 mL/min (by C-G formula based on SCr of 1 mg/dL). Liver Function Tests: No results for input(s): AST, ALT, ALKPHOS, BILITOT,  PROT, ALBUMIN in the last 168 hours. No results for input(s): LIPASE, AMYLASE in the last 168 hours. No results for input(s): AMMONIA in the last 168 hours. Coagulation Profile: No results for input(s): INR, PROTIME in the last 168 hours. Cardiac Enzymes: No results for input(s): CKTOTAL, CKMB, CKMBINDEX, TROPONINI in the last 168 hours. BNP (last 3 results) No results for input(s): PROBNP in the last 8760 hours. HbA1C: No results for input(s): HGBA1C in the last 72 hours. CBG: No results for input(s): GLUCAP in the last 168 hours. Lipid Profile: No results for input(s): CHOL, HDL, LDLCALC, TRIG, CHOLHDL, LDLDIRECT in the last 72 hours. Thyroid Function Tests: No results for input(s): TSH, T4TOTAL, FREET4, T3FREE, THYROIDAB in the last 72 hours. Anemia Panel: No results for input(s): VITAMINB12, FOLATE, FERRITIN, TIBC, IRON, RETICCTPCT in the last 72 hours. Sepsis Labs:  Recent Labs Lab 08/22/16 1605 08/23/16 0003  LATICACIDVEN 2.43* 1.4    Recent Results (from the past 240 hour(s))  Urine culture     Status: None   Collection Time: 08/22/16  5:05 PM  Result Value Ref Range Status   Specimen Description URINE, CATHETERIZED  Final   Special Requests Normal  Final   Culture NO GROWTH  Final   Report Status 08/23/2016 FINAL  Final  Culture, blood (routine x 2)     Status: None (Preliminary result)   Collection Time: 08/22/16  6:10 PM  Result Value Ref Range Status   Specimen Description BLOOD RIGHT ANTECUBITAL  Final   Special Requests BOTTLES DRAWN AEROBIC ONLY 10CC  Final   Culture NO GROWTH < 24 HOURS  Final   Report Status PENDING  Incomplete  Culture, blood (routine x 2)     Status: None (Preliminary result)   Collection Time: 08/22/16  6:12 PM  Result Value Ref Range Status   Specimen Description BLOOD RIGHT HAND  Final   Special Requests IN PEDIATRIC BOTTLE 3CC  Final   Culture NO GROWTH < 24 HOURS  Final   Report Status PENDING  Incomplete          Radiology Studies: Dg Chest 2 View  Result Date: 08/22/2016 CLINICAL DATA:  83 year old female with cough for 1 week and abnormal pulmonary auscultation. Initial encounter. EXAM: CHEST  2 VIEW COMPARISON:  None. FINDINGS: Semi upright AP and lateral views of the chest. Abnormal patchy pulmonary opacity in the right upper and lower lung on the AP view. This is probably upper lobe and middle lobe involvement. On the lateral view there is associated abnormal posterior lower lobe opacity. No definite pleural effusion. No pneumothorax or pulmonary edema. Calcified aortic atherosclerosis. Mediastinal contours are within normal limits. Visualized tracheal air column is within normal limits. Osteopenia. No acute osseous abnormality identified. Negative visible bowel gas pattern. IMPRESSION: Patchy multilobar opacity on the right is nonspecific but suspicious for pneumonia. No definite pleural effusion. If acute  infection is suspected then follow-up PA and lateral chest X-ray is recommended in 3-4 weeks following trial of antibiotic therapy to ensure resolution and exclude underlying malignancy. Electronically Signed   By: Odessa FlemingH  Hall M.D.   On: 08/22/2016 16:18        Scheduled Meds: . azithromycin  500 mg Oral Q24H  . cefTRIAXone (ROCEPHIN)  IV  1 g Intravenous Q24H  . enoxaparin (LOVENOX) injection  30 mg Subcutaneous Q24H  . potassium chloride  40 mEq Oral Q4H   Continuous Infusions:   LOS: 1 day       Rhodie Cienfuegos Annett Gulaaniel Kairav Russomanno, MD [facility] Pager [phone]  If 7PM-7AM, please contact night-coverage [email or web address] Password [email or web address] 08/23/2016, 3:28 PM

## 2016-08-23 NOTE — Progress Notes (Signed)
CPT ordered q4 w/a. Upon arrival to room, patient was eating breakfast. BBS clear. Pt assessed and will try flutter. If unable to perform will re-order CPT.

## 2016-08-24 LAB — BASIC METABOLIC PANEL
ANION GAP: 8 (ref 5–15)
BUN: 13 mg/dL (ref 6–20)
CO2: 29 mmol/L (ref 22–32)
Calcium: 9 mg/dL (ref 8.9–10.3)
Chloride: 107 mmol/L (ref 101–111)
Creatinine, Ser: 0.86 mg/dL (ref 0.44–1.00)
GFR, EST NON AFRICAN AMERICAN: 56 mL/min — AB (ref >60)
Glucose, Bld: 93 mg/dL (ref 65–99)
POTASSIUM: 4.1 mmol/L (ref 3.5–5.1)
SODIUM: 144 mmol/L (ref 135–145)

## 2016-08-24 LAB — CBC WITH DIFFERENTIAL/PLATELET
BASOS ABS: 0 10*3/uL (ref 0.0–0.1)
Basophils Relative: 0 %
EOS ABS: 0.1 10*3/uL (ref 0.0–0.7)
EOS PCT: 1 %
HCT: 44.3 % (ref 36.0–46.0)
HEMOGLOBIN: 13.9 g/dL (ref 12.0–15.0)
Lymphocytes Relative: 12 %
Lymphs Abs: 1.2 10*3/uL (ref 0.7–4.0)
MCH: 27.7 pg (ref 26.0–34.0)
MCHC: 31.4 g/dL (ref 30.0–36.0)
MCV: 88.4 fL (ref 78.0–100.0)
Monocytes Absolute: 1.3 10*3/uL — ABNORMAL HIGH (ref 0.1–1.0)
Monocytes Relative: 13 %
NEUTROS PCT: 74 %
Neutro Abs: 7.5 10*3/uL (ref 1.7–7.7)
PLATELETS: 526 10*3/uL — AB (ref 150–400)
RBC: 5.01 MIL/uL (ref 3.87–5.11)
RDW: 13.9 % (ref 11.5–15.5)
WBC: 10.1 10*3/uL (ref 4.0–10.5)

## 2016-08-24 MED ORDER — AMLODIPINE BESYLATE 10 MG PO TABS
10.0000 mg | ORAL_TABLET | Freq: Every day | ORAL | Status: DC
  Administered 2016-08-24 – 2016-08-25 (×2): 10 mg via ORAL
  Filled 2016-08-24 (×2): qty 1

## 2016-08-24 NOTE — Evaluation (Signed)
Physical Therapy Evaluation Patient Details Name: Abigail Patel MRN: [id number] DOB: 03/20/1921 Today's Date: 08/24/2016   History of Present Illness  83 y.o. female admitted to [facility] on 08/22/16 with worsening weakness and somnolence, along with persistent cough after being dx with the flu.  Pt dx with CAP of R lung, sepsis due to PNA, AKI, and hypokalemia.  Pt with significant PMHx of HTN.    Clinical Impression  Pt is generally weak and needing more physical assist than her usual baseline at home since she has been sick with the flu and now PNA.  She has support from her son at home and would benefit from HHPT to help get her back to her normal level of function (mod I with a cane).   PT to follow acutely for deficits listed below.   Please try a cane next session.  VSS throughout this session with no decrease in O2 sats during mobility on RA.     Follow Up Recommendations Home health PT;Supervision for mobility/OOB    Equipment Recommendations  None recommended by PT    Recommendations for Other Services   NA    Precautions / Restrictions Precautions Precautions: Fall      Mobility  Bed Mobility Overal bed mobility: Modified Independent             General bed mobility comments: HOB elevated and pt using railing for leverage at trunk.   Transfers Overall transfer level: Needs assistance Equipment used: Rolling walker (2 wheeled);None Transfers: Sit to/from [employer] to Stand: Min guard Stand pivot transfers: Min guard       General transfer comment: Min guard assist for safety, pt transfered to [facility] without RW min guard assist for safety and balance.   Ambulation/Gait Ambulation/Gait assistance: Min assist Ambulation Distance (Feet): 75 Feet Assistive device: Rolling walker (2 wheeled) Gait Pattern/deviations: Step-through pattern;Shuffle;Trunk flexed Gait velocity: decreased   General Gait Details: Used RW due to balance deficits seen with  transfer to [facility] [place], pt does not normally use RW and needed min assist to help steer RW and verbal cues for closer proximity.        Balance Overall balance assessment: Needs assistance Sitting-balance support: Feet supported;No upper extremity supported Sitting balance-Leahy Scale: Good     Standing balance support: Single extremity supported Standing balance-Leahy Scale: Fair                               Pertinent Vitals/Pain Pain Assessment: No/denies pain    Home Living Family/patient expects to be discharged to:: Private residence Living Arrangements: Children;Other (Comment) (son) Available Help at Discharge: Family;Available 24 hours/day Type of Home: House Home Access: Stairs to enter   [employer] of Steps: 2 Home Layout: Two level Home Equipment: Cane - single point;Bedside commode;Grab bars - tub/shower      Prior Function Level of Independence: Independent with assistive device(s)         Comments: per son at baseline she uses cane, doesn't need help with ADLs and even does some housework.  He reportede since she has had the flu he had had to physically help her more.         Extremity/Trunk Assessment   Upper Extremity Assessment Upper Extremity Assessment: Generalized weakness    Lower Extremity Assessment Lower Extremity Assessment: Generalized weakness    Cervical / Trunk Assessment Cervical / Trunk Assessment: Kyphotic  Communication   Communication: No difficulties  Cognition Arousal/Alertness: Awake/alert Behavior During Therapy: WFL for tasks assessed/performed Overall Cognitive Status: Impaired/Different from baseline Area of Impairment: Orientation;Memory Orientation Level: Situation;Time   Memory: Decreased short-term memory         General Comments: "1925" and "that's my son in the hallway" when son was not there.  Mildly confused and son not present at beginning of assessment to report baseline.      General Comments General comments (skin integrity, edema, etc.): HR 100, O2 sats 97% during mobility.  DOE 1-2/4 with productive cough during gait.         Assessment/Plan    PT Assessment Patient needs continued PT services  PT Problem List Decreased strength;Decreased activity tolerance;Decreased balance;Decreased mobility;Decreased knowledge of use of DME;Cardiopulmonary status limiting activity       PT Treatment Interventions DME instruction;Gait training;Stair training;Functional mobility training;Therapeutic activities;Therapeutic exercise;Balance training;Patient/family education    PT Goals (Current goals can be found in the Care Plan section)  Acute Rehab PT Goals Patient Stated Goal: to go home at discharge PT Goal Formulation: With patient/family Time For Goal Achievement: 09/07/16 Potential to Achieve Goals: Good    Frequency Min 3X/week           End of Session Equipment Utilized During Treatment: Gait belt Activity Tolerance: Patient limited by fatigue Patient left: in bed;with call bell/phone within reach;with bed alarm set;with family/visitor present Nurse Communication: Mobility status PT Visit Diagnosis: Unsteadiness on feet (R26.81);Muscle weakness (generalized) (M62.81);Other (comment) (decreased activity tolerance)         Time: [id number] PT Time Calculation (min) (ACUTE ONLY): 25 min   Charges:   PT Evaluation $PT Eval Low Complexity: 1 Procedure           Hatem Cull B. Mohsin Crum, PT, DPT [phone]   08/24/2016, 5:41 PM

## 2016-08-24 NOTE — Progress Notes (Signed)
PROGRESS NOTE    Abigail Patel  ZOX:[id number] DOB: [birth year] DOA: 08/22/2016 PCP: [name], MD    Brief Narrative:  83 yo female with CKD stage 3, who presents with the chief complain of weakness. About 7 days before admission, patient was diagnosed with influenza, Tamiflu was prescribed. Over the course of the next 7 days, patient was noted to have worsening weakness and somnolence, along with persistent cough. On the initial examination her pulse oxymetry was 92%. She was had tachypnea and her lungs had diffuse bilateral rales. Chest film with multilobar pneumonia. Admitted for IV antibiotic therapy and oxymetry monitoring.     Assessment & Plan:   Principal Problem:   Community acquired pneumonia of right lung Active Problems:   Essential hypertension   Hypokalemia   CKD (chronic kidney disease), stage III    1. Community acquired pneumonia, post influenza. Continue antibiotic therapy with azithromycin and ceftriaxone, patient has remained afebrile, wbc at 10.1. No tachypnea or significant dyspnea.   2. Sepsis due to pneumonia. Hold on IV fluids, will follow a restrictive IV fluids approach to avoid volume overload.   3. Hypokalemia. K corrected at 4.1 with cr down to 0.86. Follow on renal panel in am.   4. AKI.  Tolerating po well, will hold on further IV fluids.  5. HTN. Blood pressure monitoring. Will resume amlodipine but will continue to hold on losartan and hctz, to prevent hypotension or worsening renal function.   Physical therapy evaluation, with plan for discharge in am.   DVT prophylaxis: enoxaparin  Code Status: Full Family Communication: I spoke with patient's son at the bedside and all questions were addressed. Disposition Plan: Home   Consultants:     Procedures:    Antimicrobials:   Ceftriaxone  Azithromycin   Subjective: No nausea or vomiting, dyspnea improving, positive cough. No chest pain.   Objective: Vitals:   08/23/16 0511 08/23/16 1424 08/23/16 2128 08/24/16 0512  BP: (!) 125/48 129/62 (!) 149/54 (!) 149/52  Pulse: 77 88 89 83  Resp: 17 15 18 18   Temp: 99.9 F (37.7 C) 98.9 F (37.2 C) 98.5 F (36.9 C) 98.5 F (36.9 C)  TempSrc: Oral Oral Oral Oral  SpO2: 93% 96% 96% 94%  Weight:      Height:        Intake/Output Summary (Last 24 hours) at 08/24/16 0956 Last data filed at 08/24/16 0805  Gross per 24 hour  Intake                0 ml  Output              650 ml  Net             -650 ml   Filed Weights   08/22/16 1540  Weight: 48.5 kg (107 lb)    Examination:  General exam: not in pain or dyspnea E ENT: no pallor or icterus, oral mucosa moist.  Respiratory system: Rales at the right base, with no wheezing or rhonchi. Respiratory effort normal. Cardiovascular system: S1 & S2 heard, RRR. No JVD, rubs, gallops or clicks. No pedal edema. Positive murmur at the apex, systolic, 2/3-6.  Gastrointestinal system: Abdomen is nondistended, soft and nontender. No organomegaly or masses felt. Normal bowel sounds heard. Central nervous system: Alert and oriented. No focal neurological deficits. Extremities: Symmetric 5 x 5 power. Skin: No rashes, lesions or ulcers     Data Reviewed: I have personally reviewed following labs and imaging studies  CBC:  Recent Labs Lab 08/22/16 1552 08/23/16 0003 08/24/16 0629  WBC 10.7* 11.3* 10.1  NEUTROABS  --   --  7.5  HGB 14.1 12.2 13.9  HCT 44.7 38.6 44.3  MCV 87.1 86.5 88.4  PLT 432* 418* 526*   Basic Metabolic Panel:  Recent Labs Lab 08/22/16 1552 08/23/16 0003 08/24/16 0629  NA 136 140 144  K 3.3* 3.0* 4.1  CL 94* 102 107  CO2 29 31 29   GLUCOSE 182* 144* 93  BUN 25* 16 13  CREATININE 1.11* 1.00 0.86  CALCIUM 8.7* 8.2* 9.0   GFR: Estimated Creatinine Clearance: 30.6 mL/min (by C-G formula based on SCr of 0.86 mg/dL). Liver Function Tests: No results for input(s): AST, ALT, ALKPHOS, BILITOT, PROT, ALBUMIN in the last 168  hours. No results for input(s): LIPASE, AMYLASE in the last 168 hours. No results for input(s): AMMONIA in the last 168 hours. Coagulation Profile: No results for input(s): INR, PROTIME in the last 168 hours. Cardiac Enzymes: No results for input(s): CKTOTAL, CKMB, CKMBINDEX, TROPONINI in the last 168 hours. BNP (last 3 results) No results for input(s): PROBNP in the last 8760 hours. HbA1C: No results for input(s): HGBA1C in the last 72 hours. CBG: No results for input(s): GLUCAP in the last 168 hours. Lipid Profile: No results for input(s): CHOL, HDL, LDLCALC, TRIG, CHOLHDL, LDLDIRECT in the last 72 hours. Thyroid Function Tests: No results for input(s): TSH, T4TOTAL, FREET4, T3FREE, THYROIDAB in the last 72 hours. Anemia Panel: No results for input(s): VITAMINB12, FOLATE, FERRITIN, TIBC, IRON, RETICCTPCT in the last 72 hours. Sepsis Labs:  Recent Labs Lab 08/22/16 1605 08/23/16 0003  LATICACIDVEN 2.43* 1.4    Recent Results (from the past 240 hour(s))  Urine culture     Status: None   Collection Time: 08/22/16  5:05 PM  Result Value Ref Range Status   Specimen Description URINE, CATHETERIZED  Final   Special Requests Normal  Final   Culture NO GROWTH  Final   Report Status 08/23/2016 FINAL  Final  Culture, blood (routine x 2)     Status: None (Preliminary result)   Collection Time: 08/22/16  6:10 PM  Result Value Ref Range Status   Specimen Description BLOOD RIGHT ANTECUBITAL  Final   Special Requests BOTTLES DRAWN AEROBIC ONLY 10CC  Final   Culture NO GROWTH < 24 HOURS  Final   Report Status PENDING  Incomplete  Culture, blood (routine x 2)     Status: None (Preliminary result)   Collection Time: 08/22/16  6:12 PM  Result Value Ref Range Status   Specimen Description BLOOD RIGHT HAND  Final   Special Requests IN PEDIATRIC BOTTLE 3CC  Final   Culture NO GROWTH < 24 HOURS  Final   Report Status PENDING  Incomplete         Radiology Studies: Dg Chest 2  View  Result Date: 08/22/2016 CLINICAL DATA:  83 year old female with cough for 1 week and abnormal pulmonary auscultation. Initial encounter. EXAM: CHEST  2 VIEW COMPARISON:  None. FINDINGS: Semi upright AP and lateral views of the chest. Abnormal patchy pulmonary opacity in the right upper and lower lung on the AP view. This is probably upper lobe and middle lobe involvement. On the lateral view there is associated abnormal posterior lower lobe opacity. No definite pleural effusion. No pneumothorax or pulmonary edema. Calcified aortic atherosclerosis. Mediastinal contours are within normal limits. Visualized tracheal air column is within normal limits. Osteopenia. No acute osseous abnormality identified. Negative visible bowel  gas pattern. IMPRESSION: Patchy multilobar opacity on the right is nonspecific but suspicious for pneumonia. No definite pleural effusion. If acute infection is suspected then follow-up PA and lateral chest X-ray is recommended in 3-4 weeks following trial of antibiotic therapy to ensure resolution and exclude underlying malignancy. Electronically Signed   By: Odessa FlemingH  Hall M.D.   On: 08/22/2016 16:18        Scheduled Meds: . azithromycin  500 mg Oral Q24H  . cefTRIAXone (ROCEPHIN)  IV  1 g Intravenous Q24H  . enoxaparin (LOVENOX) injection  30 mg Subcutaneous Q24H   Continuous Infusions:   LOS: 2 days       Abigail Hosek Annett Gulaaniel Michell Kader, MD [facility] Pager [phone]  If 7PM-7AM, please contact night-coverage [email or web address] Password [email or web address] 08/24/2016, 9:56 AM

## 2016-08-25 LAB — CBC WITH DIFFERENTIAL/PLATELET
BASOS ABS: 0 10*3/uL (ref 0.0–0.1)
BASOS PCT: 0 %
EOS ABS: 0.1 10*3/uL (ref 0.0–0.7)
EOS PCT: 1 %
HCT: 48.3 % — ABNORMAL HIGH (ref 36.0–46.0)
Hemoglobin: 15.4 g/dL — ABNORMAL HIGH (ref 12.0–15.0)
Lymphocytes Relative: 21 %
Lymphs Abs: 1.9 10*3/uL (ref 0.7–4.0)
MCH: 28.4 pg (ref 26.0–34.0)
MCHC: 31.9 g/dL (ref 30.0–36.0)
MCV: 89.1 fL (ref 78.0–100.0)
Monocytes Absolute: 0.7 10*3/uL (ref 0.1–1.0)
Monocytes Relative: 8 %
Neutro Abs: 6.4 10*3/uL (ref 1.7–7.7)
Neutrophils Relative %: 70 %
PLATELETS: 577 10*3/uL — AB (ref 150–400)
RBC: 5.42 MIL/uL — AB (ref 3.87–5.11)
RDW: 14.3 % (ref 11.5–15.5)
WBC: 9.1 10*3/uL (ref 4.0–10.5)

## 2016-08-25 LAB — BASIC METABOLIC PANEL
ANION GAP: 13 (ref 5–15)
BUN: 9 mg/dL (ref 6–20)
CALCIUM: 9.4 mg/dL (ref 8.9–10.3)
CO2: 27 mmol/L (ref 22–32)
Chloride: 105 mmol/L (ref 101–111)
Creatinine, Ser: 0.82 mg/dL (ref 0.44–1.00)
GFR calc Af Amer: >60 mL/min (ref >60)
GFR, EST NON AFRICAN AMERICAN: 59 mL/min — AB (ref >60)
Glucose, Bld: 92 mg/dL (ref 65–99)
POTASSIUM: 3.8 mmol/L (ref 3.5–5.1)
SODIUM: 145 mmol/L (ref 135–145)

## 2016-08-25 MED ORDER — AMOXICILLIN-POT CLAVULANATE 875-125 MG PO TABS: 1.0000 | ORAL_TABLET | Freq: Two times a day (BID) | ORAL | 0 refills | Status: DC

## 2016-08-25 MED ORDER — AMOXICILLIN-POT CLAVULANATE 875-125 MG PO TABS
1.0000 | ORAL_TABLET | Freq: Two times a day (BID) | ORAL | Status: DC
  Administered 2016-08-25: 1 via ORAL
  Filled 2016-08-25: qty 1

## 2016-08-25 NOTE — Discharge Summary (Signed)
Physician Discharge Summary  Abigail Patel AVW:[id number] DOB: August 31, 1920 DOA: 08/22/2016  PCP: [name], MD  Admit date: 08/22/2016 Discharge date: 08/25/2016  Admitted From:  Home  Disposition:  Home   Recommendations for Outpatient Follow-up:  1. Follow up with PCP in 1- week 2. Patient placed on Augmentin for the next 6 days, to complete 8 days of therapy.   Home Health: Yes  Equipment/Devices: No   Discharge Condition: Stable  CODE STATUS: Full  Diet recommendation: Heart Healthy  Brief/Interim Summary: This is a 83 year old female who presented to the hospital with a chief complaint of weakness. She was recently diagnosed with influenza, she completed a course of Tamiflu. Since her diagnosis of influenza she has been developing worsening weakness requiring assistance to her activities of daily living. Symptoms have been associated with cough and decreased appetite. Due to her worsening weakness she was brought into the hospital for further evaluation. Initial physical examination blood pressure 101/52, heart rate 87, temperature 99.8, respiratory rate 21, oxygen saturation 96%. Heart S1-S2 present rhythmic, no gallops or murmurs. Lungs with a rales at the right base. Her abdomen was soft, no lower extremity edema. Sodium 136, potassium 3.3, chloride 94, bicarbonate 29, glucose 182, creatinine 1.11, white count 10.7, Hb 14.1, hematocrit 44.7, platelets 432, lactic acid 2.4, urinalysis negative for infection, EKG was sinus rhythm with premature atrial complexes. Her chest x-ray had patchy infiltrates in the right upper and right lower lobe.   The patient was admitted to the hospital with a working diagnosis of sepsis due to community-acquired pneumonia, post influenza pneumonia, complicated by hypokalemia and acute kidney injury.  1. Sepsis due to community-acquired pneumonia. Patient was admitted to the medical unit, she was placed on antibiotic therapy with ceftriaxone and  azithromycin. She received isotonic IV fluids. Her cultures came back no growth. She was successfully transitioned to oral antibiotics, with Augmentin that she will take for 6 more days to complete 8 days therapy. Discharge oximetry 94% on room air. Patient will go home with home health services.  2. Acute kidney injury and Hypokalemia. Prerenal, renal failure, kidney function improved with isotonic IV fluids. Potassium was corrected, discharge creatinine 0.82, discharge potassium 3.8.  3. Hypertension. Initially antihypertensives agents were held. Amlodipine was resumed during her hospitalization and by the time of discharge she'll resume losartan- hydrochlorothiazide. Discharge blood pressure 120 to 140 systolic.    Discharge Diagnoses:  Principal Problem:   Community acquired pneumonia of right lung Active Problems:   Essential hypertension   Hypokalemia   CKD (chronic kidney disease), stage III    Discharge Instructions   Allergies as of 08/25/2016   No Known Allergies     Medication List    TAKE these medications   amLODipine 10 MG tablet Commonly known as:  NORVASC Take 10 mg by mouth daily.   amoxicillin-clavulanate 875-125 MG tablet Commonly known as:  AUGMENTIN Take 1 tablet by mouth every 12 (twelve) hours.   [employer] COUGH DM 30 MG/5ML liquid Generic drug:  dextromethorphan Take 5 mLs by mouth daily as needed for cough.   losartan-hydrochlorothiazide 100-25 MG tablet Commonly known as:  HYZAAR Take 1 tablet by mouth daily.      Follow-up Information    BLAND,[name], MD Follow up in 1 week(s).   Specialty:  Family Medicine Contact information: [address] [place] [address] [phone]          No Known Allergies  Consultations:     Procedures/Studies: Dg  Chest 2 View  Result Date: 08/22/2016 CLINICAL DATA:  83 year old female with cough for 1 week and abnormal pulmonary auscultation. Initial encounter. EXAM: CHEST  2 VIEW COMPARISON:   None. FINDINGS: Semi upright AP and lateral views of the chest. Abnormal patchy pulmonary opacity in the right upper and lower lung on the AP view. This is probably upper lobe and middle lobe involvement. On the lateral view there is associated abnormal posterior lower lobe opacity. No definite pleural effusion. No pneumothorax or pulmonary edema. Calcified aortic atherosclerosis. Mediastinal contours are within normal limits. Visualized tracheal air column is within normal limits. Osteopenia. No acute osseous abnormality identified. Negative visible bowel gas pattern. IMPRESSION: Patchy multilobar opacity on the right is nonspecific but suspicious for pneumonia. No definite pleural effusion. If acute infection is suspected then follow-up PA and lateral chest X-ray is recommended in 3-4 weeks following trial of antibiotic therapy to ensure resolution and exclude underlying malignancy. Electronically Signed   By: Odessa FlemingH  Hall M.D.   On: 08/22/2016 16:18    (Echo, Carotid, EGD, Colonoscopy, ERCP)    Subjective: Patient feeling better, dyspnea and cough have improved, tolerating po well, no nausea or vomiting.   Discharge Exam: Vitals:   08/24/16 2217 08/25/16 0620  BP: 128/68 (!) 129/59  Pulse: 86 77  Resp: 18 17  Temp: 99.2 F (37.3 C) 98.5 F (36.9 C)   Vitals:   08/24/16 1242 08/24/16 1730 08/24/16 2217 08/25/16 0620  BP: (!) 125/56  128/68 (!) 129/59  Pulse: 96 100 86 77  Resp: 20  18 17   Temp: 98.2 F (36.8 C)  99.2 F (37.3 C) 98.5 F (36.9 C)  TempSrc: Oral  Oral Oral  SpO2: 94% 97% 97% 94%  Weight:      Height:        General: Pt is alert, awake, not in acute distress Cardiovascular: RRR, S1/S2 +, no rubs, no gallops Respiratory: CTA bilaterally, no wheezing, no rhonchi. Positive rales at the right base.  Abdominal: Soft, NT, ND, bowel sounds + Extremities: no edema, no cyanosis    The results of significant diagnostics from this hospitalization (including imaging,  microbiology, ancillary and laboratory) are listed below for reference.     Microbiology: Recent Results (from the past 240 hour(s))  Urine culture     Status: None   Collection Time: 08/22/16  5:05 PM  Result Value Ref Range Status   Specimen Description URINE, CATHETERIZED  Final   Special Requests Normal  Final   Culture NO GROWTH  Final   Report Status 08/23/2016 FINAL  Final  Culture, blood (routine x 2)     Status: None (Preliminary result)   Collection Time: 08/22/16  6:10 PM  Result Value Ref Range Status   Specimen Description BLOOD RIGHT ANTECUBITAL  Final   Special Requests BOTTLES DRAWN AEROBIC ONLY 10CC  Final   Culture NO GROWTH 2 DAYS  Final   Report Status PENDING  Incomplete  Culture, blood (routine x 2)     Status: None (Preliminary result)   Collection Time: 08/22/16  6:12 PM  Result Value Ref Range Status   Specimen Description BLOOD RIGHT HAND  Final   Special Requests IN PEDIATRIC BOTTLE 3CC  Final   Culture NO GROWTH 2 DAYS  Final   Report Status PENDING  Incomplete     Labs: BNP (last 3 results) No results for input(s): BNP in the last 8760 hours. Basic Metabolic Panel:  Recent Labs Lab 08/22/16 1552 08/23/16 0003 08/24/16  1610 08/25/16 0812  NA 136 140 144 145  K 3.3* 3.0* 4.1 3.8  CL 94* 102 107 105  CO2 29 31 29 27   GLUCOSE 182* 144* 93 92  BUN 25* 16 13 9   CREATININE 1.11* 1.00 0.86 0.82  CALCIUM 8.7* 8.2* 9.0 9.4   Liver Function Tests: No results for input(s): AST, ALT, ALKPHOS, BILITOT, PROT, ALBUMIN in the last 168 hours. No results for input(s): LIPASE, AMYLASE in the last 168 hours. No results for input(s): AMMONIA in the last 168 hours. CBC:  Recent Labs Lab 08/22/16 1552 08/23/16 0003 08/24/16 0629 08/25/16 0812  WBC 10.7* 11.3* 10.1 9.1  NEUTROABS  --   --  7.5 6.4  HGB 14.1 12.2 13.9 15.4*  HCT 44.7 38.6 44.3 48.3*  MCV 87.1 86.5 88.4 89.1  PLT 432* 418* 526* 577*   Cardiac Enzymes: No results for input(s):  CKTOTAL, CKMB, CKMBINDEX, TROPONINI in the last 168 hours. BNP: Invalid input(s): POCBNP CBG: No results for input(s): GLUCAP in the last 168 hours. D-Dimer No results for input(s): DDIMER in the last 72 hours. Hgb A1c No results for input(s): HGBA1C in the last 72 hours. Lipid Profile No results for input(s): CHOL, HDL, LDLCALC, TRIG, CHOLHDL, LDLDIRECT in the last 72 hours. Thyroid function studies No results for input(s): TSH, T4TOTAL, T3FREE, THYROIDAB in the last 72 hours.  Invalid input(s): FREET3 Anemia work up No results for input(s): VITAMINB12, FOLATE, FERRITIN, TIBC, IRON, RETICCTPCT in the last 72 hours. Urinalysis    Component Value Date/Time   COLORURINE YELLOW 08/22/2016 1705   APPEARANCEUR CLEAR 08/22/2016 1705   LABSPEC 1.009 08/22/2016 1705   PHURINE 6.0 08/22/2016 1705   GLUCOSEU NEGATIVE 08/22/2016 1705   HGBUR SMALL (A) 08/22/2016 1705   BILIRUBINUR NEGATIVE 08/22/2016 1705   KETONESUR NEGATIVE 08/22/2016 1705   PROTEINUR NEGATIVE 08/22/2016 1705   NITRITE NEGATIVE 08/22/2016 1705   LEUKOCYTESUR NEGATIVE 08/22/2016 1705   Sepsis Labs Invalid input(s): PROCALCITONIN,  WBC,  LACTICIDVEN Microbiology Recent Results (from the past 240 hour(s))  Urine culture     Status: None   Collection Time: 08/22/16  5:05 PM  Result Value Ref Range Status   Specimen Description URINE, CATHETERIZED  Final   Special Requests Normal  Final   Culture NO GROWTH  Final   Report Status 08/23/2016 FINAL  Final  Culture, blood (routine x 2)     Status: None (Preliminary result)   Collection Time: 08/22/16  6:10 PM  Result Value Ref Range Status   Specimen Description BLOOD RIGHT ANTECUBITAL  Final   Special Requests BOTTLES DRAWN AEROBIC ONLY 10CC  Final   Culture NO GROWTH 2 DAYS  Final   Report Status PENDING  Incomplete  Culture, blood (routine x 2)     Status: None (Preliminary result)   Collection Time: 08/22/16  6:12 PM  Result Value Ref Range Status   Specimen  Description BLOOD RIGHT HAND  Final   Special Requests IN PEDIATRIC BOTTLE 3CC  Final   Culture NO GROWTH 2 DAYS  Final   Report Status PENDING  Incomplete     Time coordinating discharge: 45 minutes  SIGNED:   Coralie Keens, MD  [facility] 08/25/2016, 9:20 AM Pager   If 7PM-7AM, please contact night-coverage [email or web address] Password [email or web address]

## 2016-08-25 NOTE — Care Management Note (Addendum)
Case Management Note  Patient Details  Name: Lorelle Formosalice Redlich MRN: [id number] Date of Birth: 05-11-1921  Subjective/Objective:     Admitted with PNA.              Archie BalboaHenry Menzel (Son)     [phone]        PCP: Renaye RakersVeita Bland   Action/Plan: Plan is to d/c to home today.  Expected Discharge Date:  08/25/16               Expected Discharge Plan:  Home w Home Health Services  In-House Referral:     Discharge planning Services  CM Consult  Post Acute Care Choice:    Choice offered to:  Patient  DME Arranged:    DME Agency:     HH Arranged:  PT, RN (REFERRAL MADE WITH DONNA [phone]) HH Agency:  [facility]  Status of Service:  Completed, signed off  If discussed at Long Length of Stay Meetings, dates discussed:    Additional Comments:  Epifanio LeschesCole, Macallister Ashmead Hudson, RN 08/25/2016, 10:44 AM

## 2016-08-25 NOTE — Care Management Important Message (Signed)
Important Message  Patient Details  Name: Abigail Patel MRN: [id number] Date of Birth: 05-Sep-1920   Medicare Important Message Given:  Yes    [name] 08/25/2016, 1:16 PM

## 2016-08-25 NOTE — Progress Notes (Signed)
Patient discharge instruction given, Patient daughter Luetta NuttingHattie at bedside. IV removed and presciptions called in to pharmacy.

## 2016-08-27 DIAGNOSIS — N183 Chronic kidney disease, stage 3 (moderate): Secondary | ICD-10-CM | POA: Diagnosis not present

## 2016-08-27 DIAGNOSIS — I129 Hypertensive chronic kidney disease with stage 1 through stage 4 chronic kidney disease, or unspecified chronic kidney disease: Secondary | ICD-10-CM | POA: Diagnosis not present

## 2016-08-27 DIAGNOSIS — Z8709 Personal history of other diseases of the respiratory system: Secondary | ICD-10-CM | POA: Diagnosis not present

## 2016-08-27 DIAGNOSIS — J189 Pneumonia, unspecified organism: Secondary | ICD-10-CM | POA: Diagnosis not present

## 2016-08-27 LAB — CULTURE, BLOOD (ROUTINE X 2)
CULTURE: NO GROWTH
CULTURE: NO GROWTH

## 2016-08-29 DIAGNOSIS — I129 Hypertensive chronic kidney disease with stage 1 through stage 4 chronic kidney disease, or unspecified chronic kidney disease: Secondary | ICD-10-CM | POA: Diagnosis not present

## 2016-08-29 DIAGNOSIS — J189 Pneumonia, unspecified organism: Secondary | ICD-10-CM | POA: Diagnosis not present

## 2016-08-29 DIAGNOSIS — N183 Chronic kidney disease, stage 3 (moderate): Secondary | ICD-10-CM | POA: Diagnosis not present

## 2016-08-29 DIAGNOSIS — Z8709 Personal history of other diseases of the respiratory system: Secondary | ICD-10-CM | POA: Diagnosis not present

## 2016-08-31 ENCOUNTER — Other Ambulatory Visit: Payer: Self-pay

## 2016-08-31 DIAGNOSIS — J189 Pneumonia, unspecified organism: Secondary | ICD-10-CM | POA: Diagnosis not present

## 2016-08-31 NOTE — Patient Outreach (Signed)
[facility] Care Management  08/31/2016  Abigail Formosalice Patel 09-19-1920 [id number]       EMMI- PCNA RED ON EMMI ALERT Day # 2 Date: 08/30/16 Red Alert Reason: " Taking all meds as doctor instructed? No"    Outreach attempt #1 to patient. Patient resting and deferred call to her son. Reviewed and addressed red alert. Son states that patient must have misunderstood question as she she is HOH at times. He reports that between him and his brother they mange all of patient's meds and gives meds to her. He denies any questions or concerns regarding meds. He states that patient is doing fairly well since discharge from the hospital. Patient's dtr scheduled PCP f/u appt. Her children will provide transportation and take her to appts. He denies any further RN CM needs or concerns at this time. Advised that patient would continue to get automated post discharge EMMI PNA calls and [facility] if any of her responses are abnormal. He voiced understanding and was appreciative of call.     Plan: RN CM will notify [facility] of case status.   Antionette Fairyoshanda Dencil Cayson, RN,BSN,CCM [facility] Care Management Telephonic Care Management Coordinator Direct Phone: [phone] Toll Free: [phone] Fax: [phone]

## 2016-09-04 ENCOUNTER — Other Ambulatory Visit: Payer: Self-pay

## 2016-09-04 NOTE — Patient Outreach (Signed)
[facility] Care Management  09/04/2016  Abigail Patel [birth year] [id number]     EMMI- PCNA RED ON EMMI ALERT Day # 5 Date: 09/02/16 Red Alert Reason:" Felling better overall? No"    Outreach attempt #1 to patient.Spoke with patient and her son. They both confirmed that it must have been error in recording of response. Patient had been doing "just fine" since discharge. No new issues or concerns at this time. No further RN CM needs or concerns at this time.     Plan: RN CM will notify [facility] administrative assistant of case status.   [name], RN,BSN,CCM [facility] Care Management Telephonic Care Management Coordinator Direct Phone: [phone] Toll Free: [phone] Fax: [phone]

## 2016-09-05 DIAGNOSIS — I129 Hypertensive chronic kidney disease with stage 1 through stage 4 chronic kidney disease, or unspecified chronic kidney disease: Secondary | ICD-10-CM | POA: Diagnosis not present

## 2016-09-05 DIAGNOSIS — J189 Pneumonia, unspecified organism: Secondary | ICD-10-CM | POA: Diagnosis not present

## 2016-09-05 DIAGNOSIS — N183 Chronic kidney disease, stage 3 (moderate): Secondary | ICD-10-CM | POA: Diagnosis not present

## 2016-09-05 DIAGNOSIS — Z8709 Personal history of other diseases of the respiratory system: Secondary | ICD-10-CM | POA: Diagnosis not present

## 2016-09-07 DIAGNOSIS — I129 Hypertensive chronic kidney disease with stage 1 through stage 4 chronic kidney disease, or unspecified chronic kidney disease: Secondary | ICD-10-CM | POA: Diagnosis not present

## 2016-09-07 DIAGNOSIS — N183 Chronic kidney disease, stage 3 (moderate): Secondary | ICD-10-CM | POA: Diagnosis not present

## 2016-09-07 DIAGNOSIS — Z8709 Personal history of other diseases of the respiratory system: Secondary | ICD-10-CM | POA: Diagnosis not present

## 2016-09-07 DIAGNOSIS — J189 Pneumonia, unspecified organism: Secondary | ICD-10-CM | POA: Diagnosis not present

## 2016-09-08 DIAGNOSIS — J189 Pneumonia, unspecified organism: Secondary | ICD-10-CM | POA: Diagnosis not present

## 2016-09-08 DIAGNOSIS — I129 Hypertensive chronic kidney disease with stage 1 through stage 4 chronic kidney disease, or unspecified chronic kidney disease: Secondary | ICD-10-CM | POA: Diagnosis not present

## 2016-09-08 DIAGNOSIS — Z8709 Personal history of other diseases of the respiratory system: Secondary | ICD-10-CM | POA: Diagnosis not present

## 2016-09-08 DIAGNOSIS — N183 Chronic kidney disease, stage 3 (moderate): Secondary | ICD-10-CM | POA: Diagnosis not present

## 2016-09-12 DIAGNOSIS — I129 Hypertensive chronic kidney disease with stage 1 through stage 4 chronic kidney disease, or unspecified chronic kidney disease: Secondary | ICD-10-CM | POA: Diagnosis not present

## 2016-09-12 DIAGNOSIS — N183 Chronic kidney disease, stage 3 (moderate): Secondary | ICD-10-CM | POA: Diagnosis not present

## 2016-09-12 DIAGNOSIS — Z8709 Personal history of other diseases of the respiratory system: Secondary | ICD-10-CM | POA: Diagnosis not present

## 2016-09-12 DIAGNOSIS — J189 Pneumonia, unspecified organism: Secondary | ICD-10-CM | POA: Diagnosis not present

## 2016-09-13 DIAGNOSIS — Z8709 Personal history of other diseases of the respiratory system: Secondary | ICD-10-CM | POA: Diagnosis not present

## 2016-09-13 DIAGNOSIS — N183 Chronic kidney disease, stage 3 (moderate): Secondary | ICD-10-CM | POA: Diagnosis not present

## 2016-09-13 DIAGNOSIS — J189 Pneumonia, unspecified organism: Secondary | ICD-10-CM | POA: Diagnosis not present

## 2016-09-13 DIAGNOSIS — I129 Hypertensive chronic kidney disease with stage 1 through stage 4 chronic kidney disease, or unspecified chronic kidney disease: Secondary | ICD-10-CM | POA: Diagnosis not present

## 2016-09-15 DIAGNOSIS — N183 Chronic kidney disease, stage 3 (moderate): Secondary | ICD-10-CM | POA: Diagnosis not present

## 2016-09-15 DIAGNOSIS — Z8709 Personal history of other diseases of the respiratory system: Secondary | ICD-10-CM | POA: Diagnosis not present

## 2016-09-15 DIAGNOSIS — I129 Hypertensive chronic kidney disease with stage 1 through stage 4 chronic kidney disease, or unspecified chronic kidney disease: Secondary | ICD-10-CM | POA: Diagnosis not present

## 2016-09-15 DIAGNOSIS — J189 Pneumonia, unspecified organism: Secondary | ICD-10-CM | POA: Diagnosis not present

## 2016-09-19 DIAGNOSIS — N183 Chronic kidney disease, stage 3 (moderate): Secondary | ICD-10-CM | POA: Diagnosis not present

## 2016-09-19 DIAGNOSIS — J189 Pneumonia, unspecified organism: Secondary | ICD-10-CM | POA: Diagnosis not present

## 2016-09-19 DIAGNOSIS — Z8709 Personal history of other diseases of the respiratory system: Secondary | ICD-10-CM | POA: Diagnosis not present

## 2016-09-19 DIAGNOSIS — I129 Hypertensive chronic kidney disease with stage 1 through stage 4 chronic kidney disease, or unspecified chronic kidney disease: Secondary | ICD-10-CM | POA: Diagnosis not present

## 2016-09-21 DIAGNOSIS — J189 Pneumonia, unspecified organism: Secondary | ICD-10-CM | POA: Diagnosis not present

## 2016-09-21 DIAGNOSIS — Z8709 Personal history of other diseases of the respiratory system: Secondary | ICD-10-CM | POA: Diagnosis not present

## 2016-09-21 DIAGNOSIS — N183 Chronic kidney disease, stage 3 (moderate): Secondary | ICD-10-CM | POA: Diagnosis not present

## 2016-09-21 DIAGNOSIS — I129 Hypertensive chronic kidney disease with stage 1 through stage 4 chronic kidney disease, or unspecified chronic kidney disease: Secondary | ICD-10-CM | POA: Diagnosis not present

## 2016-09-28 DIAGNOSIS — N183 Chronic kidney disease, stage 3 (moderate): Secondary | ICD-10-CM | POA: Diagnosis not present

## 2016-09-28 DIAGNOSIS — I129 Hypertensive chronic kidney disease with stage 1 through stage 4 chronic kidney disease, or unspecified chronic kidney disease: Secondary | ICD-10-CM | POA: Diagnosis not present

## 2016-09-28 DIAGNOSIS — J189 Pneumonia, unspecified organism: Secondary | ICD-10-CM | POA: Diagnosis not present

## 2016-09-28 DIAGNOSIS — Z8709 Personal history of other diseases of the respiratory system: Secondary | ICD-10-CM | POA: Diagnosis not present

## 2016-10-04 DIAGNOSIS — J189 Pneumonia, unspecified organism: Secondary | ICD-10-CM | POA: Diagnosis not present

## 2016-10-04 DIAGNOSIS — Z8709 Personal history of other diseases of the respiratory system: Secondary | ICD-10-CM | POA: Diagnosis not present

## 2016-10-04 DIAGNOSIS — N183 Chronic kidney disease, stage 3 (moderate): Secondary | ICD-10-CM | POA: Diagnosis not present

## 2016-10-04 DIAGNOSIS — I129 Hypertensive chronic kidney disease with stage 1 through stage 4 chronic kidney disease, or unspecified chronic kidney disease: Secondary | ICD-10-CM | POA: Diagnosis not present

## 2016-10-25 DIAGNOSIS — N183 Chronic kidney disease, stage 3 (moderate): Secondary | ICD-10-CM | POA: Diagnosis not present

## 2016-10-25 DIAGNOSIS — I129 Hypertensive chronic kidney disease with stage 1 through stage 4 chronic kidney disease, or unspecified chronic kidney disease: Secondary | ICD-10-CM | POA: Diagnosis not present

## 2016-10-25 DIAGNOSIS — J189 Pneumonia, unspecified organism: Secondary | ICD-10-CM | POA: Diagnosis not present

## 2016-10-25 DIAGNOSIS — Z8709 Personal history of other diseases of the respiratory system: Secondary | ICD-10-CM | POA: Diagnosis not present

## 2016-10-26 DIAGNOSIS — N183 Chronic kidney disease, stage 3 (moderate): Secondary | ICD-10-CM | POA: Diagnosis not present

## 2016-10-26 DIAGNOSIS — Z8701 Personal history of pneumonia (recurrent): Secondary | ICD-10-CM | POA: Diagnosis not present

## 2016-10-26 DIAGNOSIS — Z8709 Personal history of other diseases of the respiratory system: Secondary | ICD-10-CM | POA: Diagnosis not present

## 2016-10-26 DIAGNOSIS — I129 Hypertensive chronic kidney disease with stage 1 through stage 4 chronic kidney disease, or unspecified chronic kidney disease: Secondary | ICD-10-CM | POA: Diagnosis not present

## 2016-10-31 DIAGNOSIS — Z8701 Personal history of pneumonia (recurrent): Secondary | ICD-10-CM | POA: Diagnosis not present

## 2016-10-31 DIAGNOSIS — Z8709 Personal history of other diseases of the respiratory system: Secondary | ICD-10-CM | POA: Diagnosis not present

## 2016-10-31 DIAGNOSIS — N183 Chronic kidney disease, stage 3 (moderate): Secondary | ICD-10-CM | POA: Diagnosis not present

## 2016-10-31 DIAGNOSIS — I129 Hypertensive chronic kidney disease with stage 1 through stage 4 chronic kidney disease, or unspecified chronic kidney disease: Secondary | ICD-10-CM | POA: Diagnosis not present

## 2016-11-09 DIAGNOSIS — N183 Chronic kidney disease, stage 3 (moderate): Secondary | ICD-10-CM | POA: Diagnosis not present

## 2016-11-09 DIAGNOSIS — I129 Hypertensive chronic kidney disease with stage 1 through stage 4 chronic kidney disease, or unspecified chronic kidney disease: Secondary | ICD-10-CM | POA: Diagnosis not present

## 2016-11-09 DIAGNOSIS — Z8709 Personal history of other diseases of the respiratory system: Secondary | ICD-10-CM | POA: Diagnosis not present

## 2016-11-09 DIAGNOSIS — Z8701 Personal history of pneumonia (recurrent): Secondary | ICD-10-CM | POA: Diagnosis not present

## 2016-11-15 DIAGNOSIS — Z8701 Personal history of pneumonia (recurrent): Secondary | ICD-10-CM | POA: Diagnosis not present

## 2016-11-15 DIAGNOSIS — I129 Hypertensive chronic kidney disease with stage 1 through stage 4 chronic kidney disease, or unspecified chronic kidney disease: Secondary | ICD-10-CM | POA: Diagnosis not present

## 2016-11-15 DIAGNOSIS — Z8709 Personal history of other diseases of the respiratory system: Secondary | ICD-10-CM | POA: Diagnosis not present

## 2016-11-15 DIAGNOSIS — N183 Chronic kidney disease, stage 3 (moderate): Secondary | ICD-10-CM | POA: Diagnosis not present

## 2016-11-23 DIAGNOSIS — Z8709 Personal history of other diseases of the respiratory system: Secondary | ICD-10-CM | POA: Diagnosis not present

## 2016-11-23 DIAGNOSIS — I129 Hypertensive chronic kidney disease with stage 1 through stage 4 chronic kidney disease, or unspecified chronic kidney disease: Secondary | ICD-10-CM | POA: Diagnosis not present

## 2016-11-23 DIAGNOSIS — Z8701 Personal history of pneumonia (recurrent): Secondary | ICD-10-CM | POA: Diagnosis not present

## 2016-11-23 DIAGNOSIS — N183 Chronic kidney disease, stage 3 (moderate): Secondary | ICD-10-CM | POA: Diagnosis not present

## 2016-12-14 DIAGNOSIS — L218 Other seborrheic dermatitis: Secondary | ICD-10-CM | POA: Diagnosis not present

## 2016-12-14 DIAGNOSIS — R634 Abnormal weight loss: Secondary | ICD-10-CM | POA: Diagnosis not present

## 2016-12-14 DIAGNOSIS — I10 Essential (primary) hypertension: Secondary | ICD-10-CM | POA: Diagnosis not present

## 2016-12-14 DIAGNOSIS — R4181 Age-related cognitive decline: Secondary | ICD-10-CM | POA: Diagnosis not present

## 2017-02-28 DIAGNOSIS — I10 Essential (primary) hypertension: Secondary | ICD-10-CM | POA: Diagnosis not present

## 2017-02-28 DIAGNOSIS — R634 Abnormal weight loss: Secondary | ICD-10-CM | POA: Diagnosis not present

## 2017-05-03 DIAGNOSIS — R4181 Age-related cognitive decline: Secondary | ICD-10-CM | POA: Diagnosis not present

## 2017-05-03 DIAGNOSIS — I10 Essential (primary) hypertension: Secondary | ICD-10-CM | POA: Diagnosis not present

## 2017-05-03 DIAGNOSIS — L218 Other seborrheic dermatitis: Secondary | ICD-10-CM | POA: Diagnosis not present

## 2017-05-03 DIAGNOSIS — Z23 Encounter for immunization: Secondary | ICD-10-CM | POA: Diagnosis not present

## 2017-09-21 DIAGNOSIS — R4181 Age-related cognitive decline: Secondary | ICD-10-CM | POA: Diagnosis not present

## 2017-09-21 DIAGNOSIS — E118 Type 2 diabetes mellitus with unspecified complications: Secondary | ICD-10-CM | POA: Diagnosis not present

## 2017-09-21 DIAGNOSIS — L218 Other seborrheic dermatitis: Secondary | ICD-10-CM | POA: Diagnosis not present

## 2017-09-21 DIAGNOSIS — R21 Rash and other nonspecific skin eruption: Secondary | ICD-10-CM | POA: Diagnosis not present

## 2017-09-21 DIAGNOSIS — I10 Essential (primary) hypertension: Secondary | ICD-10-CM | POA: Diagnosis not present

## 2017-09-21 DIAGNOSIS — E039 Hypothyroidism, unspecified: Secondary | ICD-10-CM | POA: Diagnosis not present

## 2017-09-21 DIAGNOSIS — E785 Hyperlipidemia, unspecified: Secondary | ICD-10-CM | POA: Diagnosis not present

## 2017-09-21 DIAGNOSIS — R634 Abnormal weight loss: Secondary | ICD-10-CM | POA: Diagnosis not present

## 2018-01-18 DIAGNOSIS — Z6825 Body mass index (BMI) 25.0-25.9, adult: Secondary | ICD-10-CM | POA: Diagnosis not present

## 2018-01-18 DIAGNOSIS — I10 Essential (primary) hypertension: Secondary | ICD-10-CM | POA: Diagnosis not present

## 2018-01-23 ENCOUNTER — Other Ambulatory Visit: Payer: Self-pay

## 2018-03-25 DIAGNOSIS — J069 Acute upper respiratory infection, unspecified: Secondary | ICD-10-CM | POA: Diagnosis not present

## 2018-06-10 DIAGNOSIS — Z23 Encounter for immunization: Secondary | ICD-10-CM | POA: Diagnosis not present

## 2018-07-19 DIAGNOSIS — I10 Essential (primary) hypertension: Secondary | ICD-10-CM | POA: Diagnosis not present

## 2018-09-04 IMAGING — DX DG CHEST 2V
2 series · 2 of 2 positions shown · non-contrast
Comparison: None.

CLINICAL DATA: [AGE] female with cough for 1 week and
abnormal pulmonary auscultation. Initial encounter.

EXAM:
CHEST  2 VIEW

[chest lat]
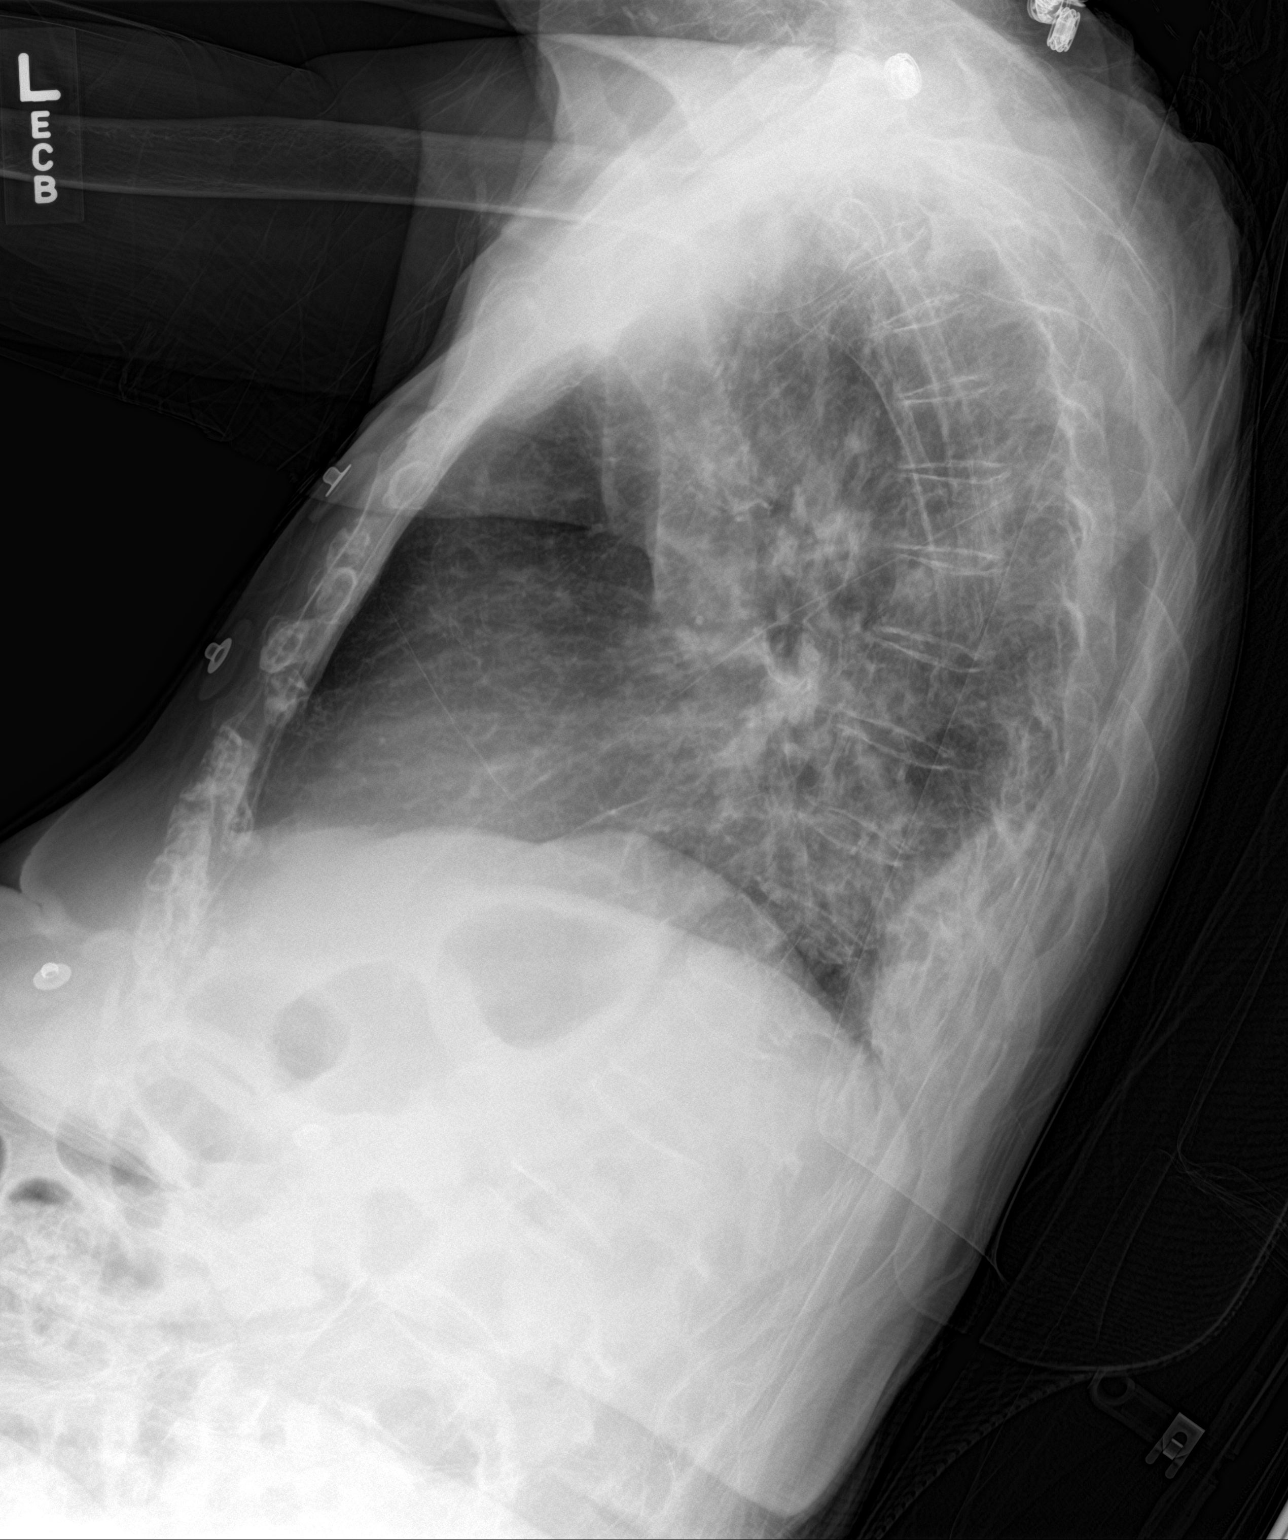

[chest ap]
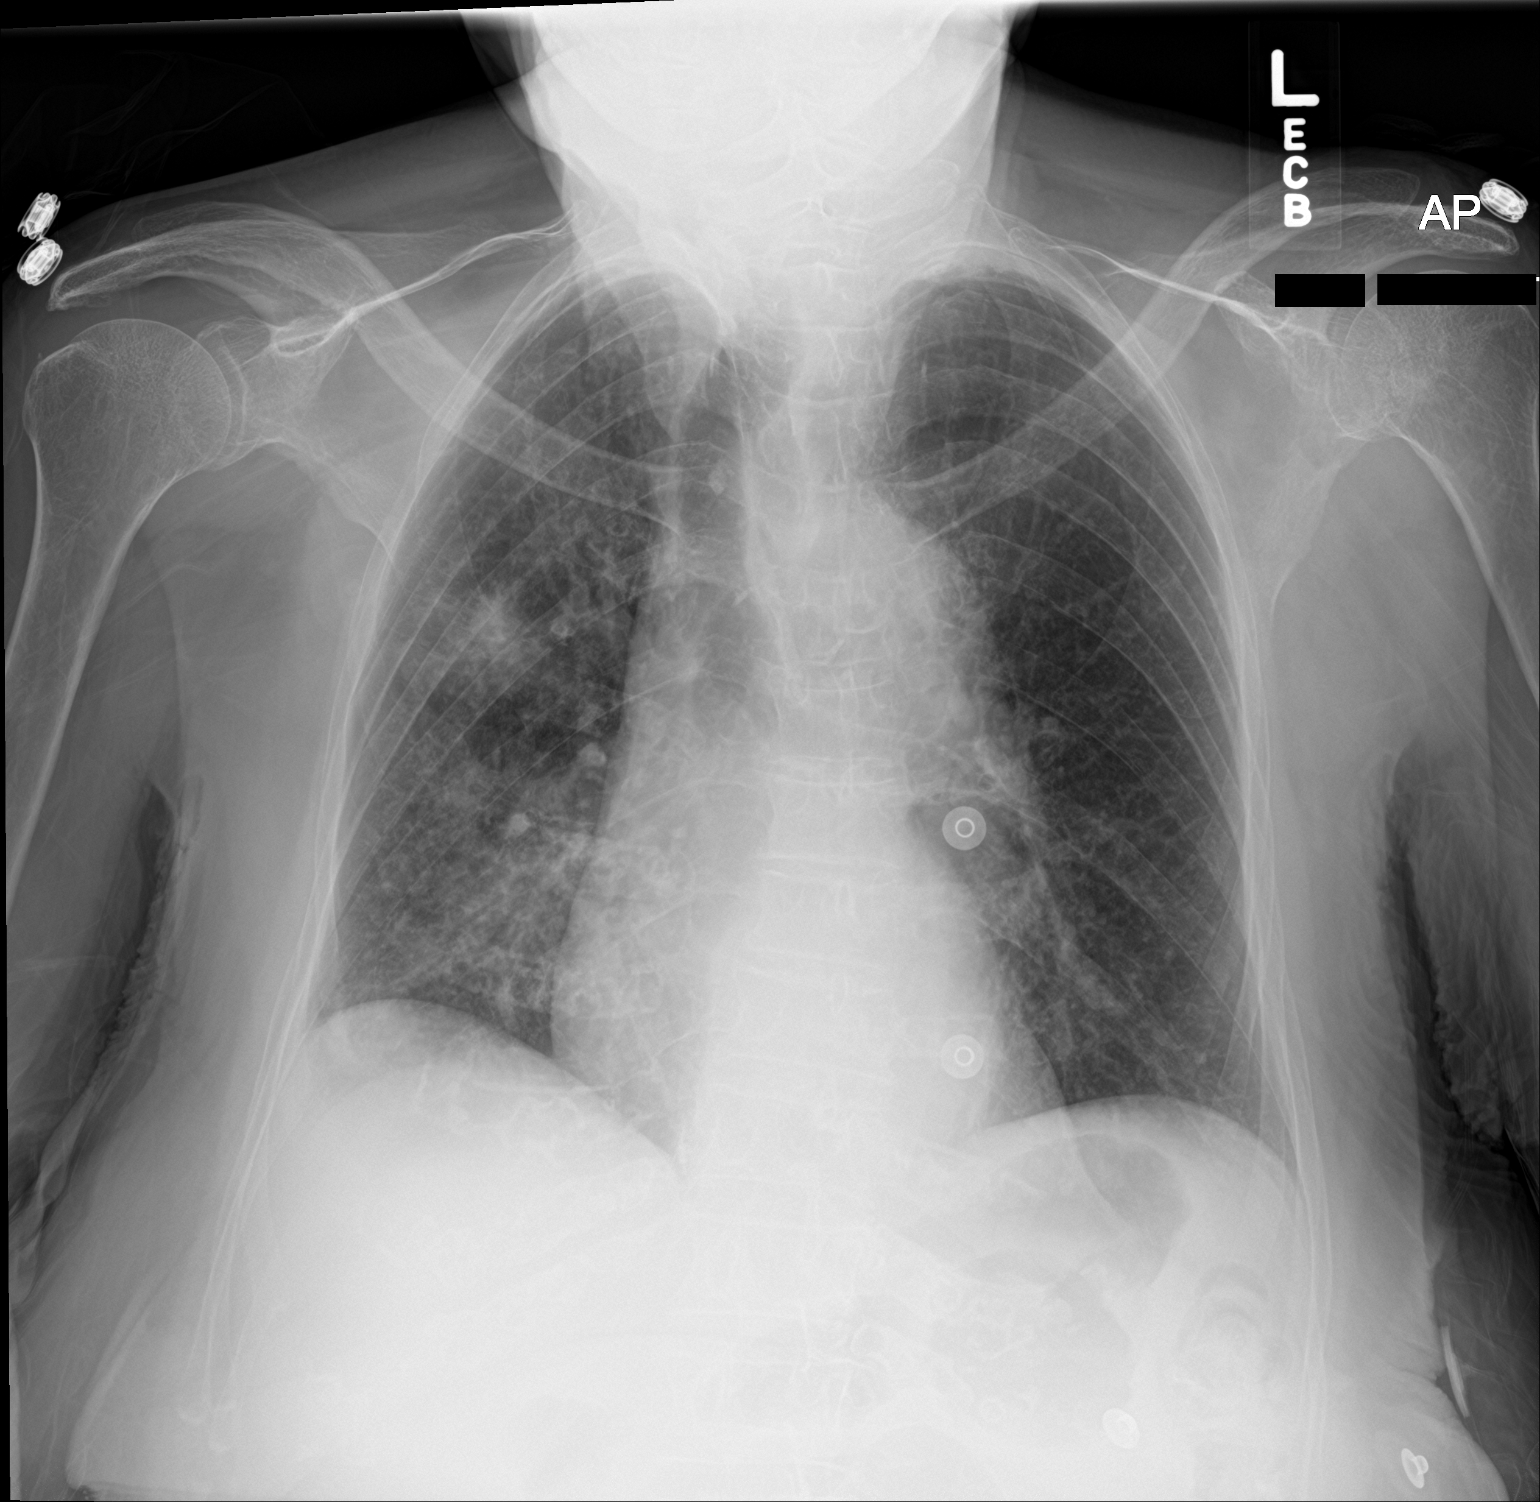

[2 of 2 positions shown; findings below may reference images not displayed]

FINDINGS: Semi upright AP and lateral views of the chest. Abnormal patchy
pulmonary opacity in the right upper and lower lung on the AP view.
This is probably upper lobe and middle lobe involvement. On the
lateral view there is associated abnormal posterior lower lobe
opacity. No definite pleural effusion. No pneumothorax or pulmonary
edema. Calcified aortic atherosclerosis. Mediastinal contours are
within normal limits. Visualized tracheal air column is within
normal limits. Osteopenia. No acute osseous abnormality identified.
Negative visible bowel gas pattern.
IMPRESSION: Patchy multilobar opacity on the right is nonspecific but suspicious
for pneumonia. No definite pleural effusion.

If acute infection is suspected then follow-up PA and lateral chest
X-ray is recommended in 3-4 weeks following trial of antibiotic
therapy to ensure resolution and exclude underlying malignancy.

## 2018-12-20 DIAGNOSIS — I10 Essential (primary) hypertension: Secondary | ICD-10-CM | POA: Diagnosis not present

## 2018-12-20 DIAGNOSIS — R21 Rash and other nonspecific skin eruption: Secondary | ICD-10-CM | POA: Diagnosis not present

## 2019-05-05 ENCOUNTER — Encounter (HOSPITAL_COMMUNITY): Payer: Self-pay | Admitting: Emergency Medicine

## 2019-05-05 ENCOUNTER — Other Ambulatory Visit: Payer: Self-pay

## 2019-05-05 ENCOUNTER — Emergency Department (HOSPITAL_COMMUNITY): Payer: Medicare Other

## 2019-05-05 ENCOUNTER — Inpatient Hospital Stay (HOSPITAL_COMMUNITY)
Admission: EM | Admit: 2019-05-05 | Discharge: 2019-05-27 | DRG: 177 | Disposition: E | Payer: Medicare Other | Attending: Internal Medicine | Admitting: Internal Medicine

## 2019-05-05 DIAGNOSIS — I10 Essential (primary) hypertension: Secondary | ICD-10-CM | POA: Diagnosis present

## 2019-05-05 DIAGNOSIS — Z79899 Other long term (current) drug therapy: Secondary | ICD-10-CM

## 2019-05-05 DIAGNOSIS — E860 Dehydration: Secondary | ICD-10-CM

## 2019-05-05 DIAGNOSIS — R262 Difficulty in walking, not elsewhere classified: Secondary | ICD-10-CM | POA: Diagnosis present

## 2019-05-05 DIAGNOSIS — E876 Hypokalemia: Secondary | ICD-10-CM | POA: Diagnosis present

## 2019-05-05 DIAGNOSIS — Z7189 Other specified counseling: Secondary | ICD-10-CM

## 2019-05-05 DIAGNOSIS — D696 Thrombocytopenia, unspecified: Secondary | ICD-10-CM | POA: Diagnosis present

## 2019-05-05 DIAGNOSIS — G9341 Metabolic encephalopathy: Secondary | ICD-10-CM | POA: Diagnosis present

## 2019-05-05 DIAGNOSIS — Z781 Physical restraint status: Secondary | ICD-10-CM

## 2019-05-05 DIAGNOSIS — U071 COVID-19: Principal | ICD-10-CM | POA: Diagnosis present

## 2019-05-05 DIAGNOSIS — R410 Disorientation, unspecified: Secondary | ICD-10-CM | POA: Diagnosis not present

## 2019-05-05 DIAGNOSIS — N179 Acute kidney failure, unspecified: Secondary | ICD-10-CM | POA: Diagnosis present

## 2019-05-05 DIAGNOSIS — R0600 Dyspnea, unspecified: Secondary | ICD-10-CM

## 2019-05-05 DIAGNOSIS — J1289 Other viral pneumonia: Secondary | ICD-10-CM | POA: Diagnosis not present

## 2019-05-05 DIAGNOSIS — Z515 Encounter for palliative care: Secondary | ICD-10-CM | POA: Diagnosis present

## 2019-05-05 DIAGNOSIS — Z681 Body mass index (BMI) 19 or less, adult: Secondary | ICD-10-CM

## 2019-05-05 DIAGNOSIS — E43 Unspecified severe protein-calorie malnutrition: Secondary | ICD-10-CM | POA: Diagnosis present

## 2019-05-05 DIAGNOSIS — F0390 Unspecified dementia without behavioral disturbance: Secondary | ICD-10-CM | POA: Diagnosis present

## 2019-05-05 DIAGNOSIS — R509 Fever, unspecified: Secondary | ICD-10-CM | POA: Diagnosis not present

## 2019-05-05 DIAGNOSIS — J9601 Acute respiratory failure with hypoxia: Secondary | ICD-10-CM | POA: Diagnosis not present

## 2019-05-05 DIAGNOSIS — R300 Dysuria: Secondary | ICD-10-CM | POA: Diagnosis not present

## 2019-05-05 DIAGNOSIS — Z8249 Family history of ischemic heart disease and other diseases of the circulatory system: Secondary | ICD-10-CM

## 2019-05-05 DIAGNOSIS — I4891 Unspecified atrial fibrillation: Secondary | ICD-10-CM | POA: Diagnosis present

## 2019-05-05 DIAGNOSIS — R531 Weakness: Secondary | ICD-10-CM

## 2019-05-05 DIAGNOSIS — R918 Other nonspecific abnormal finding of lung field: Secondary | ICD-10-CM | POA: Diagnosis not present

## 2019-05-05 DIAGNOSIS — R404 Transient alteration of awareness: Secondary | ICD-10-CM | POA: Diagnosis not present

## 2019-05-05 DIAGNOSIS — R0902 Hypoxemia: Secondary | ICD-10-CM | POA: Diagnosis not present

## 2019-05-05 DIAGNOSIS — Z66 Do not resuscitate: Secondary | ICD-10-CM | POA: Diagnosis not present

## 2019-05-05 DIAGNOSIS — Z833 Family history of diabetes mellitus: Secondary | ICD-10-CM

## 2019-05-05 DIAGNOSIS — I129 Hypertensive chronic kidney disease with stage 1 through stage 4 chronic kidney disease, or unspecified chronic kidney disease: Secondary | ICD-10-CM | POA: Diagnosis present

## 2019-05-05 DIAGNOSIS — N183 Chronic kidney disease, stage 3 unspecified: Secondary | ICD-10-CM | POA: Diagnosis present

## 2019-05-05 DIAGNOSIS — J9600 Acute respiratory failure, unspecified whether with hypoxia or hypercapnia: Secondary | ICD-10-CM

## 2019-05-05 LAB — COMPREHENSIVE METABOLIC PANEL
ALT: 12 U/L (ref 0–44)
AST: 32 U/L (ref 15–41)
Albumin: 2.5 g/dL — ABNORMAL LOW (ref 3.5–5.0)
Alkaline Phosphatase: 42 U/L (ref 38–126)
Anion gap: 11 (ref 5–15)
BUN: 20 mg/dL (ref 8–23)
CO2: 19 mmol/L — ABNORMAL LOW (ref 22–32)
Calcium: 7 mg/dL — ABNORMAL LOW (ref 8.9–10.3)
Chloride: 111 mmol/L (ref 98–111)
Creatinine, Ser: 1.28 mg/dL — ABNORMAL HIGH (ref 0.44–1.00)
GFR calc Af Amer: 41 mL/min — ABNORMAL LOW (ref >60)
GFR calc non Af Amer: 35 mL/min — ABNORMAL LOW (ref >60)
Glucose, Bld: 96 mg/dL (ref 70–99)
Potassium: 3.1 mmol/L — ABNORMAL LOW (ref 3.5–5.1)
Sodium: 141 mmol/L (ref 135–145)
Total Bilirubin: 1 mg/dL (ref 0.3–1.2)
Total Protein: 5.3 g/dL — ABNORMAL LOW (ref 6.5–8.1)

## 2019-05-05 LAB — I-STAT CHEM 8, ED
BUN: 23 mg/dL (ref 8–23)
Calcium, Ion: 1.05 mmol/L — ABNORMAL LOW (ref 1.15–1.40)
Chloride: 102 mmol/L (ref 98–111)
Creatinine, Ser: 1.4 mg/dL — ABNORMAL HIGH (ref 0.44–1.00)
Glucose, Bld: 101 mg/dL — ABNORMAL HIGH (ref 70–99)
HCT: 42 % (ref 36.0–46.0)
Hemoglobin: 14.3 g/dL (ref 12.0–15.0)
Potassium: 3.1 mmol/L — ABNORMAL LOW (ref 3.5–5.1)
Sodium: 140 mmol/L (ref 135–145)
TCO2: 23 mmol/L (ref 22–32)

## 2019-05-05 LAB — URINALYSIS, ROUTINE W REFLEX MICROSCOPIC
Bilirubin Urine: NEGATIVE
Glucose, UA: NEGATIVE mg/dL
Hgb urine dipstick: NEGATIVE
Ketones, ur: 5 mg/dL — AB
Leukocytes,Ua: NEGATIVE
Nitrite: NEGATIVE
Protein, ur: NEGATIVE mg/dL
Specific Gravity, Urine: 1.014 (ref 1.005–1.030)
pH: 5 (ref 5.0–8.0)

## 2019-05-05 LAB — CBC WITH DIFFERENTIAL/PLATELET
Abs Immature Granulocytes: 0.02 10*3/uL (ref 0.00–0.07)
Basophils Absolute: 0 10*3/uL (ref 0.0–0.1)
Basophils Relative: 0 %
Eosinophils Absolute: 0 10*3/uL (ref 0.0–0.5)
Eosinophils Relative: 0 %
HCT: 39.2 % (ref 36.0–46.0)
Hemoglobin: 11.8 g/dL — ABNORMAL LOW (ref 12.0–15.0)
Immature Granulocytes: 0 %
Lymphocytes Relative: 15 %
Lymphs Abs: 0.7 10*3/uL (ref 0.7–4.0)
MCH: 27.4 pg (ref 26.0–34.0)
MCHC: 30.1 g/dL (ref 30.0–36.0)
MCV: 91 fL (ref 80.0–100.0)
Monocytes Absolute: 0.7 10*3/uL (ref 0.1–1.0)
Monocytes Relative: 16 %
Neutro Abs: 3.1 10*3/uL (ref 1.7–7.7)
Neutrophils Relative %: 69 %
Platelets: 25 10*3/uL — CL (ref 150–400)
RBC: 4.31 MIL/uL (ref 3.87–5.11)
RDW: 14.6 % (ref 11.5–15.5)
WBC: 4.6 10*3/uL (ref 4.0–10.5)
nRBC: 0 % (ref 0.0–0.2)

## 2019-05-05 MED ORDER — SODIUM CHLORIDE 0.9 % IV SOLN: INTRAVENOUS | Status: DC

## 2019-05-05 MED ORDER — ACETAMINOPHEN 325 MG PO TABS
650.0000 mg | ORAL_TABLET | Freq: Once | ORAL | Status: AC
  Administered 2019-05-05: 650 mg via ORAL
  Filled 2019-05-05: qty 2

## 2019-05-05 MED ORDER — SODIUM CHLORIDE 0.9 % IV BOLUS
500.0000 mL | Freq: Once | INTRAVENOUS | Status: AC
  Administered 2019-05-05: 500 mL via INTRAVENOUS

## 2019-05-05 NOTE — ED Triage Notes (Signed)
Per EMS pt's husband reported AMS for past x3 days with increased urination. Hx of UTI's. Pt's only complaint is weakness with ambulation.

## 2019-05-05 NOTE — ED Provider Notes (Signed)
[place] EMERGENCY DEPARTMENT Provider Note   CSN: [id number] Arrival date & time: 05/19/2019  1713     History   Chief Complaint Chief Complaint  Patient presents with  . Altered Mental Status    HPI Abigail Patel is a 85 y.o. female.     HPI Presents concern of weakness. Patient has a very poor hearing, but denies focal pain, acknowledges weakness. Onset is uncertain, but according to the patient's husband who also provides details of the HPI, patient was well starting about 3 days ago she has had weakness, without new fall, without vomiting, without diarrhea. There is seemingly new polyuria, but no reported dysuria. The patient herself denies pain, denies vomiting, denies diarrhea as well. She also denies fever, though she is febrile here on the initial exam. She states that she takes her medication as directed, and it does not seem as though there have been recent changes. The patient's difficulty with hearing limits initial exam somewhat, she does seem to be interacting to questions appropriately when she can hear them. Past Medical History:  Diagnosis Date  . Hypertension     Patient Active Problem List   Diagnosis Date Noted  . Community acquired pneumonia of right lung 08/22/2016  . Essential hypertension 08/22/2016  . Hypokalemia 08/22/2016  . CKD (chronic kidney disease), stage III 08/22/2016    History reviewed. No pertinent surgical history.   OB History   No obstetric history on file.      Home Medications    Prior to Admission medications   Medication Sig Start Date End Date Taking? Authorizing Provider  amLODipine (NORVASC) 10 MG tablet Take 10 mg by mouth daily. 05/07/14   [provider]  amoxicillin-clavulanate (AUGMENTIN) 875-125 MG tablet Take 1 tablet by mouth every 12 (twelve) hours. 08/25/16   Arrien, Jimmy Picket, MD  [facility] COUGH DM 30 MG/5ML liquid Take 5 mLs by mouth daily as needed for cough. 08/17/16    [provider]  losartan-hydrochlorothiazide (HYZAAR) 100-25 MG per tablet Take 1 tablet by mouth daily. 05/07/14   [provider]    Family History Family History  Problem Relation Age of Onset  . Diabetes Other   . Hypertension Other     Social History Social History   Tobacco Use  . Smoking status: Never Smoker  . Smokeless tobacco: Never Used  Substance Use Topics  . Alcohol use: No  . Drug use: No     Allergies   Patient has no known allergies.   Review of Systems Review of Systems  Constitutional:       Per HPI, otherwise negative  HENT:       Per HPI, otherwise negative  Respiratory:       Per HPI, otherwise negative  Cardiovascular:       Per HPI, otherwise negative  Gastrointestinal: Negative for abdominal pain, diarrhea, nausea and vomiting.  Endocrine:       Negative aside from HPI  Genitourinary:       Neg aside from HPI   Musculoskeletal:       Per HPI, otherwise negative  Skin: Negative.   Neurological: Positive for weakness. Negative for syncope.     Physical Exam Updated Vital Signs BP (!) 131/55   Pulse 77   Temp (!) 101.9 F (38.8 C) (Oral)   Resp 18   SpO2 96%   Physical Exam Vitals signs and nursing note reviewed.  Constitutional:      General: She is  not in acute distress.    Appearance: She is well-developed.     Comments: Elderly female in no distress, awake, alert, very poor hearing, but interacting as much as she can.  HENT:     Head: Normocephalic and atraumatic.  Eyes:     Conjunctiva/sclera: Conjunctivae normal.  Cardiovascular:     Rate and Rhythm: Normal rate and regular rhythm.  Pulmonary:     Effort: Pulmonary effort is normal. No respiratory distress.     Breath sounds: Normal breath sounds. No stridor.  Abdominal:     General: There is no distension.  Skin:    General: Skin is warm and dry.  Neurological:     Mental Status: She is alert and oriented to person, place, and time.      Motor: Atrophy present.     Comments: Hearing extremely poor, cranial nerves otherwise unremarkable, with no facial asymmetry, clear speech.  There is diffuse atrophy, but the patient does move all extremities spontaneously, roughly symmetrically in terms of strength.  Psychiatric:        Behavior: Behavior is withdrawn.      ED Treatments / Results  Labs (all labs ordered are listed, but only abnormal results are displayed) Labs Reviewed  COMPREHENSIVE METABOLIC PANEL - Abnormal; Notable for the following components:      Result Value   Potassium 3.1 (*)    CO2 19 (*)    Creatinine, Ser 1.28 (*)    Calcium 7.0 (*)    Total Protein 5.3 (*)    Albumin 2.5 (*)    GFR calc non Af Amer 35 (*)    GFR calc Af Amer 41 (*)    All other components within normal limits  CBC WITH DIFFERENTIAL/PLATELET - Abnormal; Notable for the following components:   Hemoglobin 11.8 (*)    Platelets 25 (*)    All other components within normal limits  I-STAT CHEM 8, ED - Abnormal; Notable for the following components:   Potassium 3.1 (*)    Creatinine, Ser 1.40 (*)    Glucose, Bld 101 (*)    Calcium, Ion 1.05 (*)    All other components within normal limits  SARS CORONAVIRUS 2 (TAT 6-24 HRS)  URINALYSIS, ROUTINE W REFLEX MICROSCOPIC    EKG None  Radiology Dg Chest Port 1 View  Result Date: 05/13/2019 CLINICAL DATA:  Weakness EXAM: PORTABLE CHEST 1 VIEW COMPARISON:  08/22/2016 FINDINGS: No focal airspace disease or effusion. Stable cardiomediastinal silhouette. Mild diffuse coarse interstitial opacity likely chronic change. Aortic atherosclerosis. No pneumothorax. IMPRESSION: No active disease. Mild diffuse coarse interstitial opacity likely chronic change Electronically Signed   By: Jasmine PangKim  Fujinaga M.D.   On: 08-06-2018 18:16    Procedures Procedures (including critical care time)  Medications Ordered in ED Medications  acetaminophen (TYLENOL) tablet 650 mg (650 mg Oral Given 05/10/2019 1824)   sodium chloride 0.9 % bolus 500 mL (0 mLs Intravenous Stopped 05/01/2019 2113)  sodium chloride 0.9 % bolus 500 mL (500 mLs Intravenous New Bag/Given 05/12/2019 2124)     Initial Impression / Assessment and Plan / ED Course  I have reviewed the triage vital signs and the nursing notes.  Pertinent labs & imaging results that were available during my care of the patient were reviewed by me and considered in my medical decision making (see chart for details).    Initial labs notable for elevated creatinine, 50% above most recent value. Patient has received initial fluids, and now, with downtrending blood pressure  value, will receive additional bolus.     10:38 PM Patient smiling, awakens, states she feels tired.  Map 66, improved from value prior to most recent bolus. X-ray unremarkable, but with fever, concern for UTI versus viral infection, patient will require admission. She has yet to urinate, and with elevated creatinine, hypotension, there are some suspicion for dehydration contributing to this.  11:23 PM UA unremarkable, blood pressure continues to be borderline, fluids running 125 mL/h. With ketonuria, hypertension, fever, weakness, the patient will be admitted for further monitoring, management.  Covid test pending. Final Clinical Impressions(s) / ED Diagnoses   Final diagnoses:  Dehydration  Fever in adult     Gerhard Munch, MD 05/09/2019 2336

## 2019-05-06 ENCOUNTER — Encounter (HOSPITAL_COMMUNITY): Payer: Self-pay | Admitting: Radiology

## 2019-05-06 ENCOUNTER — Observation Stay (HOSPITAL_COMMUNITY): Payer: Medicare Other

## 2019-05-06 DIAGNOSIS — R627 Adult failure to thrive: Secondary | ICD-10-CM | POA: Diagnosis not present

## 2019-05-06 DIAGNOSIS — Z833 Family history of diabetes mellitus: Secondary | ICD-10-CM | POA: Diagnosis not present

## 2019-05-06 DIAGNOSIS — Z781 Physical restraint status: Secondary | ICD-10-CM | POA: Diagnosis not present

## 2019-05-06 DIAGNOSIS — I10 Essential (primary) hypertension: Secondary | ICD-10-CM | POA: Diagnosis present

## 2019-05-06 DIAGNOSIS — D72819 Decreased white blood cell count, unspecified: Secondary | ICD-10-CM | POA: Diagnosis not present

## 2019-05-06 DIAGNOSIS — R0600 Dyspnea, unspecified: Secondary | ICD-10-CM | POA: Diagnosis not present

## 2019-05-06 DIAGNOSIS — E876 Hypokalemia: Secondary | ICD-10-CM | POA: Diagnosis present

## 2019-05-06 DIAGNOSIS — Z7189 Other specified counseling: Secondary | ICD-10-CM | POA: Diagnosis not present

## 2019-05-06 DIAGNOSIS — D696 Thrombocytopenia, unspecified: Secondary | ICD-10-CM | POA: Diagnosis present

## 2019-05-06 DIAGNOSIS — J1289 Other viral pneumonia: Secondary | ICD-10-CM | POA: Diagnosis present

## 2019-05-06 DIAGNOSIS — Z515 Encounter for palliative care: Secondary | ICD-10-CM | POA: Diagnosis present

## 2019-05-06 DIAGNOSIS — Z66 Do not resuscitate: Secondary | ICD-10-CM | POA: Diagnosis not present

## 2019-05-06 DIAGNOSIS — N179 Acute kidney failure, unspecified: Secondary | ICD-10-CM | POA: Diagnosis present

## 2019-05-06 DIAGNOSIS — U071 COVID-19: Secondary | ICD-10-CM | POA: Diagnosis present

## 2019-05-06 DIAGNOSIS — F0390 Unspecified dementia without behavioral disturbance: Secondary | ICD-10-CM | POA: Diagnosis present

## 2019-05-06 DIAGNOSIS — Z79899 Other long term (current) drug therapy: Secondary | ICD-10-CM | POA: Diagnosis not present

## 2019-05-06 DIAGNOSIS — R079 Chest pain, unspecified: Secondary | ICD-10-CM | POA: Diagnosis not present

## 2019-05-06 DIAGNOSIS — E860 Dehydration: Secondary | ICD-10-CM | POA: Diagnosis present

## 2019-05-06 DIAGNOSIS — R7982 Elevated C-reactive protein (CRP): Secondary | ICD-10-CM | POA: Diagnosis not present

## 2019-05-06 DIAGNOSIS — I129 Hypertensive chronic kidney disease with stage 1 through stage 4 chronic kidney disease, or unspecified chronic kidney disease: Secondary | ICD-10-CM | POA: Diagnosis present

## 2019-05-06 DIAGNOSIS — J9621 Acute and chronic respiratory failure with hypoxia: Secondary | ICD-10-CM | POA: Diagnosis not present

## 2019-05-06 DIAGNOSIS — R509 Fever, unspecified: Secondary | ICD-10-CM | POA: Diagnosis not present

## 2019-05-06 DIAGNOSIS — R262 Difficulty in walking, not elsewhere classified: Secondary | ICD-10-CM | POA: Diagnosis present

## 2019-05-06 DIAGNOSIS — R0603 Acute respiratory distress: Secondary | ICD-10-CM | POA: Diagnosis not present

## 2019-05-06 DIAGNOSIS — N183 Chronic kidney disease, stage 3 unspecified: Secondary | ICD-10-CM | POA: Diagnosis present

## 2019-05-06 DIAGNOSIS — Z8249 Family history of ischemic heart disease and other diseases of the circulatory system: Secondary | ICD-10-CM | POA: Diagnosis not present

## 2019-05-06 DIAGNOSIS — R4182 Altered mental status, unspecified: Secondary | ICD-10-CM | POA: Diagnosis not present

## 2019-05-06 DIAGNOSIS — R0602 Shortness of breath: Secondary | ICD-10-CM | POA: Diagnosis not present

## 2019-05-06 DIAGNOSIS — R531 Weakness: Secondary | ICD-10-CM

## 2019-05-06 DIAGNOSIS — I4891 Unspecified atrial fibrillation: Secondary | ICD-10-CM | POA: Diagnosis present

## 2019-05-06 DIAGNOSIS — Z681 Body mass index (BMI) 19 or less, adult: Secondary | ICD-10-CM | POA: Diagnosis not present

## 2019-05-06 DIAGNOSIS — E43 Unspecified severe protein-calorie malnutrition: Secondary | ICD-10-CM | POA: Diagnosis present

## 2019-05-06 DIAGNOSIS — J9601 Acute respiratory failure with hypoxia: Secondary | ICD-10-CM | POA: Diagnosis not present

## 2019-05-06 DIAGNOSIS — G9341 Metabolic encephalopathy: Secondary | ICD-10-CM | POA: Diagnosis present

## 2019-05-06 LAB — CBC WITH DIFFERENTIAL/PLATELET
Abs Immature Granulocytes: 0.01 10*3/uL (ref 0.00–0.07)
Basophils Absolute: 0 10*3/uL (ref 0.0–0.1)
Basophils Relative: 0 %
Eosinophils Absolute: 0 10*3/uL (ref 0.0–0.5)
Eosinophils Relative: 0 %
HCT: 45.6 % (ref 36.0–46.0)
Hemoglobin: 14.1 g/dL (ref 12.0–15.0)
Immature Granulocytes: 0 %
Lymphocytes Relative: 27 %
Lymphs Abs: 1.2 10*3/uL (ref 0.7–4.0)
MCH: 27 pg (ref 26.0–34.0)
MCHC: 30.9 g/dL (ref 30.0–36.0)
MCV: 87.4 fL (ref 80.0–100.0)
Monocytes Absolute: 0.7 10*3/uL (ref 0.1–1.0)
Monocytes Relative: 17 %
Neutro Abs: 2.4 10*3/uL (ref 1.7–7.7)
Neutrophils Relative %: 56 %
Platelets: 226 10*3/uL (ref 150–400)
RBC: 5.22 MIL/uL — ABNORMAL HIGH (ref 3.87–5.11)
RDW: 14.5 % (ref 11.5–15.5)
WBC: 4.4 10*3/uL (ref 4.0–10.5)
nRBC: 0 % (ref 0.0–0.2)

## 2019-05-06 LAB — CBG MONITORING, ED: Glucose-Capillary: 93 mg/dL (ref 70–99)

## 2019-05-06 LAB — HEPATIC FUNCTION PANEL
ALT: 19 U/L (ref 0–44)
AST: 39 U/L (ref 15–41)
Albumin: 3.3 g/dL — ABNORMAL LOW (ref 3.5–5.0)
Alkaline Phosphatase: 57 U/L (ref 38–126)
Bilirubin, Direct: <0.1 mg/dL (ref 0.0–0.2)
Total Bilirubin: 0.6 mg/dL (ref 0.3–1.2)
Total Protein: 7.3 g/dL (ref 6.5–8.1)

## 2019-05-06 LAB — SARS CORONAVIRUS 2 (TAT 6-24 HRS): SARS Coronavirus 2: POSITIVE — AB

## 2019-05-06 LAB — C-REACTIVE PROTEIN: CRP: 6.4 mg/dL — ABNORMAL HIGH (ref <1.0)

## 2019-05-06 LAB — DIC (DISSEMINATED INTRAVASCULAR COAGULATION)PANEL
D-Dimer, Quant: 2.35 ug/mL-FEU — ABNORMAL HIGH (ref 0.00–0.50)
Fibrinogen: 545 mg/dL — ABNORMAL HIGH (ref 210–475)
INR: 1 (ref 0.8–1.2)
Platelets: 227 10*3/uL (ref 150–400)
Prothrombin Time: 12.6 seconds (ref 11.4–15.2)
Smear Review: NONE SEEN
aPTT: 30 seconds (ref 24–36)

## 2019-05-06 LAB — LACTATE DEHYDROGENASE: LDH: 208 U/L — ABNORMAL HIGH (ref 98–192)

## 2019-05-06 LAB — BASIC METABOLIC PANEL
Anion gap: 13 (ref 5–15)
BUN: 21 mg/dL (ref 8–23)
CO2: 21 mmol/L — ABNORMAL LOW (ref 22–32)
Calcium: 8.5 mg/dL — ABNORMAL LOW (ref 8.9–10.3)
Chloride: 104 mmol/L (ref 98–111)
Creatinine, Ser: 1.31 mg/dL — ABNORMAL HIGH (ref 0.44–1.00)
GFR calc Af Amer: 40 mL/min — ABNORMAL LOW (ref >60)
GFR calc non Af Amer: 34 mL/min — ABNORMAL LOW (ref >60)
Glucose, Bld: 94 mg/dL (ref 70–99)
Potassium: 3.1 mmol/L — ABNORMAL LOW (ref 3.5–5.1)
Sodium: 138 mmol/L (ref 135–145)

## 2019-05-06 LAB — ABO/RH: ABO/RH(D): O POS

## 2019-05-06 LAB — GLUCOSE, CAPILLARY: Glucose-Capillary: 106 mg/dL — ABNORMAL HIGH (ref 70–99)

## 2019-05-06 LAB — TYPE AND SCREEN
ABO/RH(D): O POS
Antibody Screen: NEGATIVE

## 2019-05-06 LAB — FERRITIN: Ferritin: 357 ng/mL — ABNORMAL HIGH (ref 11–307)

## 2019-05-06 MED ORDER — SODIUM CHLORIDE 0.9 % IV SOLN
INTRAVENOUS | Status: AC
  Administered 2019-05-06: 04:00:00 via INTRAVENOUS

## 2019-05-06 MED ORDER — ACETAMINOPHEN 650 MG RE SUPP: 650.0000 mg | Freq: Four times a day (QID) | RECTAL | Status: DC | PRN

## 2019-05-06 MED ORDER — HEPARIN SODIUM (PORCINE) 5000 UNIT/ML IJ SOLN
5000.0000 [IU] | Freq: Three times a day (TID) | INTRAMUSCULAR | Status: DC
  Administered 2019-05-06 – 2019-05-13 (×21): 5000 [IU] via SUBCUTANEOUS
  Filled 2019-05-06 (×22): qty 1

## 2019-05-06 MED ORDER — ZINC SULFATE 220 (50 ZN) MG PO CAPS
220.0000 mg | ORAL_CAPSULE | Freq: Every day | ORAL | Status: DC
  Administered 2019-05-07 – 2019-05-13 (×6): 220 mg via ORAL
  Filled 2019-05-06 (×8): qty 1

## 2019-05-06 MED ORDER — ONDANSETRON HCL 4 MG/2ML IJ SOLN: 4.0000 mg | Freq: Four times a day (QID) | INTRAMUSCULAR | Status: DC | PRN

## 2019-05-06 MED ORDER — AMLODIPINE BESYLATE 10 MG PO TABS
10.0000 mg | ORAL_TABLET | Freq: Every day | ORAL | Status: DC
  Administered 2019-05-07 – 2019-05-13 (×6): 10 mg via ORAL
  Filled 2019-05-06 (×5): qty 1
  Filled 2019-05-06: qty 2
  Filled 2019-05-06: qty 1

## 2019-05-06 MED ORDER — DIPHENHYDRAMINE HCL 50 MG/ML IJ SOLN
25.0000 mg | Freq: Once | INTRAMUSCULAR | Status: AC
  Administered 2019-05-06: 25 mg via INTRAVENOUS
  Filled 2019-05-06: qty 1

## 2019-05-06 MED ORDER — ACETAMINOPHEN 325 MG PO TABS
650.0000 mg | ORAL_TABLET | Freq: Four times a day (QID) | ORAL | Status: DC | PRN
  Administered 2019-05-13: 650 mg via ORAL
  Filled 2019-05-06: qty 2

## 2019-05-06 MED ORDER — ONDANSETRON HCL 4 MG PO TABS: 4.0000 mg | ORAL_TABLET | Freq: Four times a day (QID) | ORAL | Status: DC | PRN

## 2019-05-06 MED ORDER — VITAMIN C 500 MG PO TABS
500.0000 mg | ORAL_TABLET | Freq: Every day | ORAL | Status: DC
  Administered 2019-05-06 – 2019-05-13 (×7): 500 mg via ORAL
  Filled 2019-05-06 (×8): qty 1

## 2019-05-06 NOTE — ED Notes (Signed)
Pt has removed second IV. 

## 2019-05-06 NOTE — ED Notes (Signed)
Patient has ripped off both mittens on her hands and tore them apart leaving beads all over the negative pressure room. Patient is taking off blood pressure cuff and pulse oximetry. Patient re oriented to situation by this RN.

## 2019-05-06 NOTE — ED Notes (Signed)
Patient seen trying to take IV out of arm. Patient re oriented to room and situation by this RN. Coban placed around patients left arm.

## 2019-05-06 NOTE — ED Notes (Signed)
Meal given to pt.

## 2019-05-06 NOTE — ED Notes (Signed)
Confused pt ambulated out of the bed. Urinated on floor. Pt was cleaned and red-directed into the bed

## 2019-05-06 NOTE — ED Notes (Signed)
Pt removed Iv from hand

## 2019-05-06 NOTE — H&P (Addendum)
History and Physical    Abigail Patel AVW:[id number] DOB: July 24, 1920 DOA: 05/04/2019  PCP: Renaye RakersBland, Veita, MD  Patient coming from: Home.  Chief Complaint: Weakness.  History obtained from patient's son.  HPI: Abigail Patel is a 85 y.o. female with history of hypertension was brought to the ER because of increasing weakness and difficulty walking over the last 3 days.  Per patient's family patient was walking wobbly and not herself with mild confusion.  Has not been eating well.  Her primary care physician advised her to be brought to the ER to check for UTI.  ED Course: In the ER patient was febrile with temperature 101.9 F UA was unremarkable chest x-ray showed some coarse interstitial pattern.  On my exam patient appeared confused creatinine is increased from baseline 0.8 in 2018 and is now 1.2.  Platelets were 25.  Will need repeat CBC to make sure it is not an error.  Blood cultures drawn and started on IV fluids for acute renal failure admitted for further observation.  Review of Systems: As per HPI, rest all negative.   Past Medical History:  Diagnosis Date  . Hypertension     History reviewed. No pertinent surgical history.   reports that she has never smoked. She has never used smokeless tobacco. She reports that she does not drink alcohol or use drugs.  No Known Allergies  Family History  Problem Relation Age of Onset  . Diabetes Other   . Hypertension Other     Prior to Admission medications   Medication Sig Start Date End Date Taking? Authorizing Provider  amLODipine (NORVASC) 10 MG tablet Take 10 mg by mouth daily. 05/07/14   [provider]  amoxicillin-clavulanate (AUGMENTIN) 875-125 MG tablet Take 1 tablet by mouth every 12 (twelve) hours. 08/25/16   Arrien, York RamMauricio Daniel, MD  [facility] COUGH DM 30 MG/5ML liquid Take 5 mLs by mouth daily as needed for cough. 08/17/16   [provider]  losartan-hydrochlorothiazide (HYZAAR) 100-25 MG per tablet  Take 1 tablet by mouth daily. 05/07/14   [provider]    Physical Exam: Constitutional: Moderately built and nourished. Vitals:   September 19, 2018 1845 September 19, 2018 1900 September 19, 2018 1930 05/06/19 0018  BP: (!) 109/51 (!) 116/50 (!) 131/55 (!) 124/51  Pulse: 76 76 77 77  Resp: 19 18 18 16   Temp:    98 F (36.7 C)  TempSrc:      SpO2: 95% 95% 96% 100%   Eyes: Anicteric no pallor. ENMT: No discharge from the ears eyes nose or mouth. Neck: No mass felt.  No neck rigidity. Respiratory: No rhonchi or crepitations. Cardiovascular: S1-S2 heard. Abdomen: Soft nontender bowel sounds present. Musculoskeletal: No edema. Skin: No rash. Neurologic: Alert awake oriented to name.  Moves all extremities. Psychiatric: Appears confused.   Labs on Admission: I have personally reviewed following labs and imaging studies  CBC: Recent Labs  Lab September 19, 2018 1746 September 19, 2018 2138  WBC 4.6  --   NEUTROABS 3.1  --   HGB 11.8* 14.3  HCT 39.2 42.0  MCV 91.0  --   PLT 25*  --    Basic Metabolic Panel: Recent Labs  Lab September 19, 2018 1746 September 19, 2018 2138  NA 141 140  K 3.1* 3.1*  CL 111 102  CO2 19*  --   GLUCOSE 96 101*  BUN 20 23  CREATININE 1.28* 1.40*  CALCIUM 7.0*  --    GFR: CrCl cannot be calculated (Unknown ideal weight.). Liver Function Tests: Recent Labs  Lab  05/24/2019 1746  AST 32  ALT 12  ALKPHOS 42  BILITOT 1.0  PROT 5.3*  ALBUMIN 2.5*   No results for input(s): LIPASE, AMYLASE in the last 168 hours. No results for input(s): AMMONIA in the last 168 hours. Coagulation Profile: No results for input(s): INR, PROTIME in the last 168 hours. Cardiac Enzymes: No results for input(s): CKTOTAL, CKMB, CKMBINDEX, TROPONINI in the last 168 hours. BNP (last 3 results) No results for input(s): PROBNP in the last 8760 hours. HbA1C: No results for input(s): HGBA1C in the last 72 hours. CBG: No results for input(s): GLUCAP in the last 168 hours. Lipid Profile: No results for input(s):  CHOL, HDL, LDLCALC, TRIG, CHOLHDL, LDLDIRECT in the last 72 hours. Thyroid Function Tests: No results for input(s): TSH, T4TOTAL, FREET4, T3FREE, THYROIDAB in the last 72 hours. Anemia Panel: No results for input(s): VITAMINB12, FOLATE, FERRITIN, TIBC, IRON, RETICCTPCT in the last 72 hours. Urine analysis:    Component Value Date/Time   COLORURINE YELLOW 05-24-2019 2255   APPEARANCEUR HAZY (A) May 24, 2019 2255   LABSPEC 1.014 May 24, 2019 2255   PHURINE 5.0 05/24/19 2255   GLUCOSEU NEGATIVE May 24, 2019 2255   HGBUR NEGATIVE May 24, 2019 2255   BILIRUBINUR NEGATIVE 05-24-2019 2255   KETONESUR 5 (A) 05/24/19 2255   PROTEINUR NEGATIVE May 24, 2019 2255   NITRITE NEGATIVE 05-24-2019 2255   LEUKOCYTESUR NEGATIVE 05-24-2019 2255   Sepsis Labs: @LABRCNTIP (procalcitonin:4,lacticidven:4) )No results found for this or any previous visit (from the past 240 hour(s)).   Radiological Exams on Admission: Ct Head Wo Contrast  Result Date: 05/06/2019 CLINICAL DATA:  Altered mental status for several days EXAM: CT HEAD WITHOUT CONTRAST TECHNIQUE: Contiguous axial images were obtained from the base of the skull through the vertex without intravenous contrast. COMPARISON:  None. FINDINGS: Brain: Atrophic and chronic white matter ischemic changes are noted. No findings to suggest acute hemorrhage, acute infarction or space-occupying mass lesion are noted. Vascular: No hyperdense vessel or unexpected calcification. Skull: Normal. Negative for fracture or focal lesion. Sinuses/Orbits: Mild mucosal thickening is noted within the right maxillary antrum. Other: None. IMPRESSION: Chronic atrophic and ischemic changes without acute abnormality. Electronically Signed   By: 13/03/2019 M.D.   On: 05/06/2019 01:34   Dg Chest Port 1 View  Result Date: 05/24/2019 CLINICAL DATA:  Weakness EXAM: PORTABLE CHEST 1 VIEW COMPARISON:  08/22/2016 FINDINGS: No focal airspace disease or effusion. Stable cardiomediastinal  silhouette. Mild diffuse coarse interstitial opacity likely chronic change. Aortic atherosclerosis. No pneumothorax. IMPRESSION: No active disease. Mild diffuse coarse interstitial opacity likely chronic change Electronically Signed   By: 08/24/2016 M.D.   On: 2019-05-24 18:16    EKG: Independently reviewed.  Normal sinus rhythm.  Assessment/Plan Principal Problem:   Fever Active Problems:   Essential hypertension   ARF (acute renal failure) (HCC)   Weakness   Thrombocytopenia (HCC)    1. Fever weakness and acute encephalopathy -primarily we are to rule out COVID-19 infection.  Follow cultures. 2. Acute renal failure could be from poor oral intake for which patient will be on gentle hydration hold lisinopril. 3. Hypertension we will continue amlodipine hold lisinopril due to acute renal failure. 4. Thrombocytopenia -we will repeat a stat CBC to make sure is not an error.  If it is not in her will need further work-up. 5. Acute encephalopathy likely from fever and dehydration.  Get a CT head.  Closely observe.  Addendum -patient's COVID-19 test came back positive.  Will admit to [facility].  Will  check inflammatory markers.  Presently patient is not hypoxic.  Repeat CBC showed normal platelets.  We will keep patient on Lovenox.  Since patient's COVID-19 test came positive will need inpatient status and close monitoring.  Will order inflammatory markers.  Presently patient is not hypoxic but may need steroids and remdesivir if becomes hypoxic.   DVT prophylaxis: Presently CBC shows thrombocytopenia.  Have sent letter and we will keep patient on Lovenox. Code Status: Full code confirmed with patient's son. Family Communication: Patient's son. Disposition Plan: To be determined. Consults called: None. Admission status: Inpatient.   Rise Patience MD [facility] Pager [phone].  If 7PM-7AM, please contact night-coverage [email or web address] Password [email or web address]   05/06/2019, 2:30 AM

## 2019-05-06 NOTE — Progress Notes (Signed)
PROGRESS NOTE    Abigail Patel  RCV:[id number] DOB: August 24, 1920 DOA: 04/29/2019 PCP: Renaye Rakers, MD    Brief Narrative:  85 y.o. female with history of hypertension was brought to the ER because of increasing weakness and difficulty walking over the last 3 days.  Per patient's family patient was walking wobbly and not herself with mild confusion.  Has not been eating well.  Her primary care physician advised her to be brought to the ER to check for UTI. Pt was found to be pos for COVID  Assessment & Plan:   Principal Problem:   Fever Active Problems:   Essential hypertension   ARF (acute renal failure) (HCC)   Weakness   Thrombocytopenia (HCC)   COVID-19 virus infection   1. Covid with sepsis present on admit 1. Pt presented with fevers, encephalopathy, found to be covid pos 2. Remains on room air 3. CXR reviewed, chronic changes noted 4. Will continue on zinc and vit c. No indication for decadron or remdesivir at this time 5. CRP of 6.4, ddimer of 2.35, ferritin of 357 6. Follow inflammatory marker trends 2. Acute renal failure 1. Suspect related to dehydration 2. Lisinopril on hold 3. Given gentle IVF hydration 4. Repeat bmet in AM 3. Hypertension 1. Will continue amlodipine as tolerated 2. Holding lisinopril as per above 4. Thrombocytopenia ruled out 1. CBC repeated, found to be normal 2. Follow CBC 5. Acute encephalopathy 1. Suspect secondary to presenting dehydration and active covid status 2. Head CT personally reviewed, unremarkable   DVT prophylaxis: Heparin subQ Code Status: Full Family Communication: Pt in room, family not at bedside Disposition Plan: Uncertain at this time  Consultants:     Procedures:     Antimicrobials: Anti-infectives (From admission, onward)   None       Subjective: Confused this AM, unable to assess  Objective: Vitals:   05/06/19 1415 05/06/19 1430 05/06/19 1515 05/06/19 1530  BP: (!) 126/57     Pulse: 71 92   77  Resp: 19 19 16 16   Temp:      TempSrc:      SpO2: 93% 97%  95%    Intake/Output Summary (Last 24 hours) at 05/06/2019 1534 Last data filed at 05/07/2019 2256 Gross per 24 hour  Intake 1491.67 ml  Output -  Net 1491.67 ml   There were no vitals filed for this visit.  Examination:  General exam: Appears calm and comfortable  Respiratory system: no audible wheezing. Respiratory effort normal. Cardiovascular system: S1 & S2 heard, regular Gastrointestinal system: Abdomen is nondistended, soft and nontender. No organomegaly or masses felt. Normal bowel sounds heard. Central nervous system: Alert. No focal neurological deficits. Extremities: Symmetric 5 x 5 power. Skin: No rashes, lesions or ulcers Psychiatry:Difficult to assess given mentation.   Data Reviewed: I have personally reviewed following labs and imaging studies  CBC: Recent Labs  Lab 05/06/2019 1746 04/30/2019 2138 05/06/19 0317  WBC 4.6  --  4.4  NEUTROABS 3.1  --  2.4  HGB 11.8* 14.3 14.1  HCT 39.2 42.0 45.6  MCV 91.0  --  87.4  PLT 25*  --  227  226   Basic Metabolic Panel: Recent Labs  Lab 05/18/2019 1746 05/12/2019 2138 05/06/19 0317  NA 141 140 138  K 3.1* 3.1* 3.1*  CL 111 102 104  CO2 19*  --  21*  GLUCOSE 96 101* 94  BUN 20 23 21   CREATININE 1.28* 1.40* 1.31*  CALCIUM 7.0*  --  8.5*   GFR: CrCl cannot be calculated (Unknown ideal weight.). Liver Function Tests: Recent Labs  Lab 04/28/2019 1746 05/06/19 0317  AST 32 39  ALT 12 19  ALKPHOS 42 57  BILITOT 1.0 0.6  PROT 5.3* 7.3  ALBUMIN 2.5* 3.3*   No results for input(s): LIPASE, AMYLASE in the last 168 hours. No results for input(s): AMMONIA in the last 168 hours. Coagulation Profile: Recent Labs  Lab 05/06/19 0317  INR 1.0   Cardiac Enzymes: No results for input(s): CKTOTAL, CKMB, CKMBINDEX, TROPONINI in the last 168 hours. BNP (last 3 results) No results for input(s): PROBNP in the last 8760 hours. HbA1C: No results for  input(s): HGBA1C in the last 72 hours. CBG: Recent Labs  Lab 05/06/19 0950  GLUCAP 93   Lipid Profile: No results for input(s): CHOL, HDL, LDLCALC, TRIG, CHOLHDL, LDLDIRECT in the last 72 hours. Thyroid Function Tests: No results for input(s): TSH, T4TOTAL, FREET4, T3FREE, THYROIDAB in the last 72 hours. Anemia Panel: Recent Labs    05/06/19 0738  FERRITIN 357*   Sepsis Labs: No results for input(s): PROCALCITON, LATICACIDVEN in the last 168 hours.  Recent Results (from the past 240 hour(s))  SARS CORONAVIRUS 2 (TAT 6-24 HRS) Nasopharyngeal Nasopharyngeal Swab     Status: Abnormal   Collection Time: 05/21/2019 12:37 AM   Specimen: Nasopharyngeal Swab  Result Value Ref Range Status   SARS Coronavirus 2 POSITIVE (A) NEGATIVE Final    Comment: RESULT CALLED TO, READ BACK BY AND VERIFIED WITH: TREY Martinique RN.@0510  ON 11.10.2020 BY TCALDWELL MT. (NOTE) SARS-CoV-2 target nucleic acids are DETECTED. The SARS-CoV-2 RNA is generally detectable in upper and lower respiratory specimens during the acute phase of infection. Positive results are indicative of active infection with SARS-CoV-2. Clinical  correlation with patient history and other diagnostic information is necessary to determine patient infection status. Positive results do  not rule out bacterial infection or co-infection with other viruses. The expected result is Negative. Fact Sheet for Patients: SugarRoll.be Fact Sheet for Healthcare Providers: https://www.woods-mathews.com/ This test is not yet approved or cleared by the [place] FDA and  has been authorized for detection and/or diagnosis of SARS-CoV-2 by FDA under an Emergency Use Authorization (EUA). This EUA will remain  in effect (meaning this test ca n be used) for the duration of the COVID-19 declaration under Section 564(b)(1) of the Act, 21 U.S.C. section 360bbb-3(b)(1), unless the authorization is terminated or  revoked sooner. Performed at [facility], [place] [address], [place], [place] [address]      Radiology Studies: Ct Head Wo Contrast  Result Date: 05/06/2019 CLINICAL DATA:  Altered mental status for several days EXAM: CT HEAD WITHOUT CONTRAST TECHNIQUE: Contiguous axial images were obtained from the base of the skull through the vertex without intravenous contrast. COMPARISON:  None. FINDINGS: Brain: Atrophic and chronic white matter ischemic changes are noted. No findings to suggest acute hemorrhage, acute infarction or space-occupying mass lesion are noted. Vascular: No hyperdense vessel or unexpected calcification. Skull: Normal. Negative for fracture or focal lesion. Sinuses/Orbits: Mild mucosal thickening is noted within the right maxillary antrum. Other: None. IMPRESSION: Chronic atrophic and ischemic changes without acute abnormality. Electronically Signed   By: Inez Catalina M.D.   On: 05/06/2019 01:34   Dg Chest Port 1 View  Result Date: 05/17/2019 CLINICAL DATA:  Weakness EXAM: PORTABLE CHEST 1 VIEW COMPARISON:  08/22/2016 FINDINGS: No focal airspace disease or effusion. Stable cardiomediastinal silhouette. Mild diffuse coarse interstitial opacity likely chronic  change. Aortic atherosclerosis. No pneumothorax. IMPRESSION: No active disease. Mild diffuse coarse interstitial opacity likely chronic change Electronically Signed   By: Jasmine PangKim  Fujinaga M.D.   On: 05/16/2019 18:16    Scheduled Meds: . amLODipine  10 mg Oral Daily  . heparin injection (subcutaneous)  5,000 Units Subcutaneous Q8H   Continuous Infusions: . sodium chloride 75 mL/hr at 05/06/19 1530     LOS: 0 days   Rickey BarbaraStephen Matson Welch, MD [facility] Pager On Amion  If 7PM-7AM, please contact night-coverage 05/06/2019, 3:34 PM

## 2019-05-06 NOTE — ED Notes (Signed)
Patient up and trying to get out of bed. Patient continues to take blood pressure cuff off and pulse oximetry. Patient re oriented to time, place and situation by this RN.

## 2019-05-06 NOTE — ED Notes (Signed)
Cleaned pt and bed linens, placed new purewick and brief with chux underneath pt

## 2019-05-06 NOTE — ED Notes (Signed)
Daughter updated 

## 2019-05-06 NOTE — ED Notes (Signed)
Tele   Breakfast ordered  

## 2019-05-06 NOTE — ED Notes (Signed)
Lunch Tray Ordered @ 1102. 

## 2019-05-06 NOTE — ED Notes (Signed)
Dinner Tray Ordered @ 1812.  

## 2019-05-06 NOTE — Progress Notes (Signed)
Received patient via EMS. Initially no report called on patient. Received report from Bud, [place]  at 2042 once patient arrived. Patient placed on telemetry monitor. Sat 93% on RA. No SOB or acute distress noted. No c/o pain reported at this time. Patient relatively stable at this time. VSS. Afebrile. Bed in low locked position with bed alarm on and call light in reach. Will continue to monitor patient.

## 2019-05-06 NOTE — ED Notes (Signed)
Patient seen trying to get out of bed. Patient put back into bed by this RN.

## 2019-05-06 NOTE — Plan of Care (Signed)
Patient Summary: No adverse events noted. No Sob or acute distress noted. No c/o pain reported from patient at this time. Patient daughter, Marnette Burgess, updated and addressed all questions and concerns. Patient confused but easily reoriented at this time. Bed in low locked position with call light in reach. Will continue to monitor patient.  Problem: Education: Goal: Knowledge of General Education information will improve Description: Including pain rating scale, medication(s)/side effects and non-pharmacologic comfort measures Outcome: Progressing   Problem: Health Behavior/Discharge Planning: Goal: Ability to manage health-related needs will improve Outcome: Progressing   Problem: Clinical Measurements: Goal: Ability to maintain clinical measurements within normal limits will improve Outcome: Progressing Goal: Will remain free from infection Outcome: Progressing Goal: Diagnostic test results will improve Outcome: Progressing Goal: Respiratory complications will improve Outcome: Progressing Goal: Cardiovascular complication will be avoided Outcome: Progressing   Problem: Activity: Goal: Risk for activity intolerance will decrease Outcome: Progressing   Problem: Nutrition: Goal: Adequate nutrition will be maintained Outcome: Progressing   Problem: Coping: Goal: Level of anxiety will decrease Outcome: Progressing   Problem: Elimination: Goal: Will not experience complications related to bowel motility Outcome: Progressing Goal: Will not experience complications related to urinary retention Outcome: Progressing   Problem: Pain Managment: Goal: General experience of comfort will improve Outcome: Progressing   Problem: Safety: Goal: Ability to remain free from injury will improve Outcome: Progressing   Problem: Skin Integrity: Goal: Risk for impaired skin integrity will decrease Outcome: Progressing   Problem: Education: Goal: Knowledge of risk factors and measures for  prevention of condition will improve Outcome: Progressing   Problem: Coping: Goal: Psychosocial and spiritual needs will be supported Outcome: Progressing   Problem: Respiratory: Goal: Will maintain a patent airway Outcome: Progressing Goal: Complications related to the disease process, condition or treatment will be avoided or minimized Outcome: Progressing   Problem: Urinary Elimination: Goal: Signs and symptoms of infection will decrease Outcome: Progressing   Problem: Nutrition Goal: Nutritional status is improving Description: Monitor and assess patient for malnutrition (ex- brittle hair, bruises, dry skin, pale skin and conjunctiva, muscle wasting, smooth red tongue, and disorientation). Collaborate with interdisciplinary team and initiate plan and interventions as ordered.  Monitor patient's weight and dietary intake as ordered or per policy. Utilize nutrition screening tool and intervene per policy. Determine patient's food preferences and provide high-protein, high-caloric foods as appropriate.  Outcome: Progressing

## 2019-05-06 NOTE — Progress Notes (Signed)
Patient see this AM. Full note will follow.

## 2019-05-07 DIAGNOSIS — R531 Weakness: Secondary | ICD-10-CM

## 2019-05-07 LAB — CBC WITH DIFFERENTIAL/PLATELET
Abs Immature Granulocytes: 0.02 10*3/uL (ref 0.00–0.07)
Basophils Absolute: 0 10*3/uL (ref 0.0–0.1)
Basophils Relative: 0 %
Eosinophils Absolute: 0 10*3/uL (ref 0.0–0.5)
Eosinophils Relative: 0 %
HCT: 46.5 % — ABNORMAL HIGH (ref 36.0–46.0)
Hemoglobin: 14.1 g/dL (ref 12.0–15.0)
Immature Granulocytes: 0 %
Lymphocytes Relative: 17 %
Lymphs Abs: 0.9 10*3/uL (ref 0.7–4.0)
MCH: 26.8 pg (ref 26.0–34.0)
MCHC: 30.3 g/dL (ref 30.0–36.0)
MCV: 88.2 fL (ref 80.0–100.0)
Monocytes Absolute: 0.5 10*3/uL (ref 0.1–1.0)
Monocytes Relative: 9 %
Neutro Abs: 4.1 10*3/uL (ref 1.7–7.7)
Neutrophils Relative %: 74 %
Platelets: 213 10*3/uL (ref 150–400)
RBC: 5.27 MIL/uL — ABNORMAL HIGH (ref 3.87–5.11)
RDW: 14.6 % (ref 11.5–15.5)
WBC: 5.5 10*3/uL (ref 4.0–10.5)
nRBC: 0 % (ref 0.0–0.2)

## 2019-05-07 LAB — COMPREHENSIVE METABOLIC PANEL
ALT: 20 U/L (ref 0–44)
AST: 43 U/L — ABNORMAL HIGH (ref 15–41)
Albumin: 3.3 g/dL — ABNORMAL LOW (ref 3.5–5.0)
Alkaline Phosphatase: 52 U/L (ref 38–126)
Anion gap: 15 (ref 5–15)
BUN: 17 mg/dL (ref 8–23)
CO2: 23 mmol/L (ref 22–32)
Calcium: 8.4 mg/dL — ABNORMAL LOW (ref 8.9–10.3)
Chloride: 104 mmol/L (ref 98–111)
Creatinine, Ser: 0.99 mg/dL (ref 0.44–1.00)
GFR calc Af Amer: 56 mL/min — ABNORMAL LOW (ref >60)
GFR calc non Af Amer: 48 mL/min — ABNORMAL LOW (ref >60)
Glucose, Bld: 66 mg/dL — ABNORMAL LOW (ref 70–99)
Potassium: 3.6 mmol/L (ref 3.5–5.1)
Sodium: 142 mmol/L (ref 135–145)
Total Bilirubin: 0.5 mg/dL (ref 0.3–1.2)
Total Protein: 6.9 g/dL (ref 6.5–8.1)

## 2019-05-07 LAB — PATHOLOGIST SMEAR REVIEW

## 2019-05-07 LAB — FERRITIN: Ferritin: 527 ng/mL — ABNORMAL HIGH (ref 11–307)

## 2019-05-07 LAB — C-REACTIVE PROTEIN: CRP: 5.4 mg/dL — ABNORMAL HIGH (ref <1.0)

## 2019-05-07 LAB — PROCALCITONIN: Procalcitonin: 0.12 ng/mL

## 2019-05-07 NOTE — Progress Notes (Addendum)
PROGRESS NOTE    Abigail Patel  QVZ:[id number] DOB: 02/07/1921 DOA: 2019/05/10 PCP: Lucianne Lei, MD   Brief Narrative:   85 year old with history of HTN, thrombocytopenia came to the hospital with complains of increasing weakness for the past 3 days.  Per family there is also mild confusion and.  Upon admission she was noted to be febrile with chest x-ray concerns for coarse interstitial pattern.  Ended up being diagnosed with COVID-19.  Assessment & Plan:   Principal Problem:   Fever Active Problems:   Essential hypertension   ARF (acute renal failure) (HCC)   Weakness   Thrombocytopenia (HCC)   COVID-19 virus infection  Acute kidney injury, prerenal -Unknown exact baseline.  Admission creatinine 1.4.  Slowly downtrending with IV fluids.  Caution with fluid given her bilateral lower extremity mild edema.  Acute metabolic encephalopathy, improved -CT of the head negative.  Suspect possibly from underlying infection/dehydration.  Mentation almost back to baseline.  COVID-19 pneumonia without hypoxia -Inflammatory markers mildly elevated.  We will continue to trend this. -No signs of hypoxia, hold off on Decadron/remdesivir -Supportive care.  Generalized weakness -PT/OT  Essential hypertension -Continue amlodipine -Lisinopril on hold due to AKI  Thrombocytopenia, resolved -Suspect due to acute illness.  DVT prophylaxis: Subcutaneous heparin Code Status: Full code Family Communication: Spoke with her son Mallie Mussel Disposition Plan: Maintain hospital stay for IV fluid, improvement in renal function, PT/OT evaluation for safe disposition planning.    Subjective: Patient is awake alert, denies any complaint besides feeling generally weak.  Spoke with the patient and her son.  Patient is ambulatory with the help of walker at home.  Family assists with ADL S at home.  Lives with family at home.  Review of Systems Otherwise negative except as per HPI, including: General:  Denies fever, chills, night sweats or unintended weight loss. Resp: Denies cough, wheezing, shortness of breath. Cardiac: Denies chest pain, palpitations, orthopnea, paroxysmal nocturnal dyspnea. GI: Denies abdominal pain, nausea, vomiting, diarrhea or constipation GU: Denies dysuria, frequency, hesitancy or incontinence MS: Denies muscle aches, joint pain or swelling Neuro: Denies headache, neurologic deficits (focal weakness, numbness, tingling), abnormal gait Psych: Denies anxiety, depression, SI/HI/AVH Skin: Denies new rashes or lesions ID: Denies sick contacts, exotic exposures, travel  Objective: Vitals:   05/07/19 0500 05/07/19 0600 05/07/19 0700 05/07/19 0947  BP:    (!) 120/50  Pulse: 77 71 68   Resp: 17 16 19    Temp:      TempSrc:      SpO2: 92% 92% 91%     Intake/Output Summary (Last 24 hours) at 05/07/2019 1057 Last data filed at 05/07/2019 0449 Gross per 24 hour  Intake -  Output 100 ml  Net -100 ml   There were no vitals filed for this visit.  Examination:  General exam: Appears calm and comfortable, elderly frail Respiratory system: Minimal bibasilar crackles Cardiovascular system: S1 & S2 heard, RRR. No JVD, murmurs, rubs, gallops or clicks.  Trace pedal edema. Gastrointestinal system: Abdomen is nondistended, soft and nontender. No organomegaly or masses felt. Normal bowel sounds heard. Central nervous system: Alert and oriented. No focal neurological deficits. Extremities: Symmetric 4 x 5 power. Skin: No rashes, lesions or ulcers Psychiatry: Judgement and insight appear normal. Mood & affect appropriate.   External Foley catheter in place  Data Reviewed:   CBC: Recent Labs  Lab 05/10/2019 1746 05/10/19 2138 05/06/19 0317 05/07/19 0200  WBC 4.6  --  4.4 5.5  NEUTROABS 3.1  --  2.4  4.1  HGB 11.8* 14.3 14.1 14.1  HCT 39.2 42.0 45.6 46.5*  MCV 91.0  --  87.4 88.2  PLT 25*  --  227  226 213   Basic Metabolic Panel: Recent Labs  Lab 08/29/2018  1746 08/29/2018 2138 05/06/19 0317 05/07/19 0200  NA 141 140 138 142  K 3.1* 3.1* 3.1* 3.6  CL 111 102 104 104  CO2 19*  --  21* 23  GLUCOSE 96 101* 94 66*  BUN 20 23 21 17   CREATININE 1.28* 1.40* 1.31* 0.99  CALCIUM 7.0*  --  8.5* 8.4*   GFR: CrCl cannot be calculated (Unknown ideal weight.). Liver Function Tests: Recent Labs  Lab 08/29/2018 1746 05/06/19 0317 05/07/19 0200  AST 32 39 43*  ALT 12 19 20   ALKPHOS 42 57 52  BILITOT 1.0 0.6 0.5  PROT 5.3* 7.3 6.9  ALBUMIN 2.5* 3.3* 3.3*   No results for input(s): LIPASE, AMYLASE in the last 168 hours. No results for input(s): AMMONIA in the last 168 hours. Coagulation Profile: Recent Labs  Lab 05/06/19 0317  INR 1.0   Cardiac Enzymes: No results for input(s): CKTOTAL, CKMB, CKMBINDEX, TROPONINI in the last 168 hours. BNP (last 3 results) No results for input(s): PROBNP in the last 8760 hours. HbA1C: No results for input(s): HGBA1C in the last 72 hours. CBG: Recent Labs  Lab 05/06/19 0950 05/06/19 2040  GLUCAP 93 106*   Lipid Profile: No results for input(s): CHOL, HDL, LDLCALC, TRIG, CHOLHDL, LDLDIRECT in the last 72 hours. Thyroid Function Tests: No results for input(s): TSH, T4TOTAL, FREET4, T3FREE, THYROIDAB in the last 72 hours. Anemia Panel: Recent Labs    05/06/19 0738 05/07/19 0200  FERRITIN 357* 527*   Sepsis Labs: No results for input(s): PROCALCITON, LATICACIDVEN in the last 168 hours.  Recent Results (from the past 240 hour(s))  SARS CORONAVIRUS 2 (TAT 6-24 HRS) Nasopharyngeal Nasopharyngeal Swab     Status: Abnormal   Collection Time: 08/29/2018 12:37 AM   Specimen: Nasopharyngeal Swab  Result Value Ref Range Status   SARS Coronavirus 2 POSITIVE (A) NEGATIVE Final    Comment: RESULT CALLED TO, READ BACK BY AND VERIFIED WITH: TREY SwazilandJORDAN RN.@0510  ON 11.10.2020 BY TCALDWELL MT. (NOTE) SARS-CoV-2 target nucleic acids are DETECTED. The SARS-CoV-2 RNA is generally detectable in upper and lower  respiratory specimens during the acute phase of infection. Positive results are indicative of active infection with SARS-CoV-2. Clinical  correlation with patient history and other diagnostic information is necessary to determine patient infection status. Positive results do  not rule out bacterial infection or co-infection with other viruses. The expected result is Negative. Fact Sheet for Patients: HairSlick.nohttps://www.fda.gov/media/138098/download Fact Sheet for Healthcare Providers: quierodirigir.comhttps://www.fda.gov/media/138095/download This test is not yet approved or cleared by the [place] FDA and  has been authorized for detection and/or diagnosis of SARS-CoV-2 by FDA under an Emergency Use Authorization (EUA). This EUA will remain  in effect (meaning this test ca n be used) for the duration of the COVID-19 declaration under Section 564(b)(1) of the Act, 21 U.S.C. section 360bbb-3(b)(1), unless the authorization is terminated or revoked sooner. Performed at [facility], [address] [place], [place] [address]          Radiology Studies: Ct Head Wo Contrast  Result Date: 05/06/2019 CLINICAL DATA:  Altered mental status for several days EXAM: CT HEAD WITHOUT CONTRAST TECHNIQUE: Contiguous axial images were obtained from the base of the skull through the vertex without intravenous contrast. COMPARISON:  None. FINDINGS: Brain: Atrophic and chronic white matter ischemic changes are noted. No findings to suggest acute hemorrhage, acute infarction or space-occupying mass lesion are noted. Vascular: No hyperdense vessel or unexpected calcification. Skull: Normal. Negative for fracture or focal lesion. Sinuses/Orbits: Mild mucosal thickening is noted within the right maxillary antrum. Other: None. IMPRESSION: Chronic atrophic and ischemic changes without acute abnormality. Electronically Signed   By: Alcide Clever M.D.   On: 05/06/2019 01:34   Dg Chest Port 1 View  Result Date: 05/01/2019  CLINICAL DATA:  Weakness EXAM: PORTABLE CHEST 1 VIEW COMPARISON:  08/22/2016 FINDINGS: No focal airspace disease or effusion. Stable cardiomediastinal silhouette. Mild diffuse coarse interstitial opacity likely chronic change. Aortic atherosclerosis. No pneumothorax. IMPRESSION: No active disease. Mild diffuse coarse interstitial opacity likely chronic change Electronically Signed   By: Jasmine Pang M.D.   On: 05/24/2019 18:16        Scheduled Meds: . amLODipine  10 mg Oral Daily  . heparin injection (subcutaneous)  5,000 Units Subcutaneous Q8H  . vitamin C  500 mg Oral Daily  . zinc sulfate  220 mg Oral Daily   Continuous Infusions:   LOS: 1 day   Time spent= 25 mins    Ankit Joline Maxcy, MD [facility]  If 7PM-7AM, please contact night-coverage  05/07/2019, 10:57 AM

## 2019-05-07 NOTE — Progress Notes (Signed)
Spoke with daughter gave updates.  All questions answered at this time.

## 2019-05-07 NOTE — Progress Notes (Signed)
Updated son and daughter. All questions answered. No signs of distress noted. Will continue to update as needed

## 2019-05-07 NOTE — Evaluation (Signed)
Physical Therapy Evaluation Patient Details Name: Abigail Patel MRN: [id number] DOB: 01-10-1921 Today's Date: 05/07/2019   History of Present Illness  85 y/o female pt w/ hx of HTN. presented to hospital with c/o increasing weakness for 3 days. pt dx with COVID, admitted with fever, HTN, ARF, weakness, thrombocytopenia, and COVID.  Clinical Impression   Pt admitted with above diagnosis. Pt is very confused today, on several occasions has apparently got out of bed and ambulated in room, she was witnessed cruising on furniture and once again during assessment was able to get around with HHA and min a. As per chart pt was living at home with family but not much more has been reported at this time. Pt currently with functional limitations due to the deficits listed below (see PT Problem List). Cognition is also major limitation at this time.  Pt will benefit from skilled PT to increase their independence and safety with mobility to allow discharge to the venue listed below.       Follow Up Recommendations Home health PT    Equipment Recommendations  Rolling walker with 5" wheels    Recommendations for Other Services OT consult     Precautions / Restrictions Precautions Precautions: None Restrictions Weight Bearing Restrictions: No      Mobility  Bed Mobility Overal bed mobility: Needs Assistance Bed Mobility: Supine to Sit;Sit to Supine     Supine to sit: Mod assist Sit to supine: Mod assist   General bed mobility comments: attempted to reposition in bed but has difficulty with scooting up in bed.  Transfers   Equipment used: 1 person hand held assist             General transfer comment: was able to stand up from recliner and ambulate to bed with HHA and min a  Ambulation/Gait Ambulation/Gait assistance: Min assist Gait Distance (Feet): 30 Feet Assistive device: 1 person hand held assist Gait Pattern/deviations: Wide base of support;Shuffle     General Gait  Details: with HHA able to ambulate in room, much more sturdier than when cruising on furniture.  Stairs            Wheelchair Mobility    Modified Rankin (Stroke Patients Only)       Balance Overall balance assessment: Needs assistance Sitting-balance support: Feet unsupported Sitting balance-Leahy Scale: Fair     Standing balance support: During functional activity;Single extremity supported Standing balance-Leahy Scale: Fair                               Pertinent Vitals/Pain Pain Assessment: Faces Faces Pain Scale: No hurt Pain Location: does not seem to be in any pain    Home Living                   Additional Comments: unsure of hx, states stays with family    Prior Function Level of Independence: Needs assistance   Gait / Transfers Assistance Needed: unusre of PLOF but needs was noted to be cruising on furniture earlier this am           Hand Dominance        Extremity/Trunk Assessment   Upper Extremity Assessment Upper Extremity Assessment: Defer to OT evaluation    Lower Extremity Assessment Lower Extremity Assessment: Generalized weakness       Communication   Communication: HOH  Cognition Arousal/Alertness: Awake/alert Behavior During Therapy: Restless;Agitated;Anxious Overall Cognitive Status: History of cognitive  impairments - at baseline                                 General Comments: therapist passed by room at an earlier time and pt has been attempting to ambulate in room on her own. All alarms are sounding but pt does not seem to notice. Pt assisted to recliner and nursing was notified of incident. Arrived later in day after orders received and pt is now sitting in recliner again but has feces covering B feet and floor, she seemed to have tracked it there at some point while therapist was away. Again all lines are removed and alarms are sounding again.      General Comments General comments  (skin integrity, edema, etc.): Pt found covered in feces and sitting in recliner, tracks indicate she tracked mess from bedside to recliner. All lines etc are not attched to pt and alarms are sounding.    Exercises     Assessment/Plan    PT Assessment Patient needs continued PT services  PT Problem List Decreased strength;Decreased activity tolerance;Decreased mobility;Decreased cognition;Decreased safety awareness;Decreased range of motion;Decreased balance;Decreased coordination       PT Treatment Interventions Gait training;Functional mobility training;Therapeutic exercise;Neuromuscular re-education;Patient/family education;Balance training;Therapeutic activities    PT Goals (Current goals can be found in the Care Plan section)  Acute Rehab PT Goals Patient Stated Goal: unable to state goals sec to cognition Time For Goal Achievement: 05/21/19 Potential to Achieve Goals: Fair    Frequency Min 3X/week   Barriers to discharge        Co-evaluation               AM-PAC PT "6 Clicks" Mobility  Outcome Measure Help needed turning from your back to your side while in a flat bed without using bedrails?: A Lot Help needed moving from lying on your back to sitting on the side of a flat bed without using bedrails?: A Lot Help needed moving to and from a bed to a chair (including a wheelchair)?: A Little Help needed standing up from a chair using your arms (e.g., wheelchair or bedside chair)?: A Little Help needed to walk in hospital room?: A Little Help needed climbing 3-5 steps with a railing? : A Lot 6 Click Score: 15    End of Session   Activity Tolerance: Treatment limited secondary to medical complications (Comment);Treatment limited secondary to agitation Patient left: in bed;with call bell/phone within reach;with bed alarm set Nurse Communication: Mobility status;Other (comment)(condition found in) PT Visit Diagnosis: Unsteadiness on feet (R26.81);Other abnormalities  of gait and mobility (R26.89);Muscle weakness (generalized) (M62.81)    Time: 8299-3716 PT Time Calculation (min) (ACUTE ONLY): 24 min   Charges:   PT Evaluation $PT Eval Moderate Complexity: 1 Mod PT Treatments $Therapeutic Activity: 8-22 mins        Drema Pry, PT   Freddi Starr 05/07/2019, 3:07 PM

## 2019-05-08 DIAGNOSIS — E876 Hypokalemia: Secondary | ICD-10-CM

## 2019-05-08 LAB — TYPE AND SCREEN
ABO/RH(D): O POS
Antibody Screen: NEGATIVE

## 2019-05-08 LAB — CBC WITH DIFFERENTIAL/PLATELET
Abs Immature Granulocytes: 0.02 10*3/uL (ref 0.00–0.07)
Basophils Absolute: 0 10*3/uL (ref 0.0–0.1)
Basophils Relative: 0 %
Eosinophils Absolute: 0 10*3/uL (ref 0.0–0.5)
Eosinophils Relative: 0 %
HCT: 46.2 % — ABNORMAL HIGH (ref 36.0–46.0)
Hemoglobin: 14.2 g/dL (ref 12.0–15.0)
Immature Granulocytes: 0 %
Lymphocytes Relative: 23 %
Lymphs Abs: 1 10*3/uL (ref 0.7–4.0)
MCH: 26.9 pg (ref 26.0–34.0)
MCHC: 30.7 g/dL (ref 30.0–36.0)
MCV: 87.7 fL (ref 80.0–100.0)
Monocytes Absolute: 0.4 10*3/uL (ref 0.1–1.0)
Monocytes Relative: 8 %
Neutro Abs: 3.2 10*3/uL (ref 1.7–7.7)
Neutrophils Relative %: 69 %
Platelets: 220 10*3/uL (ref 150–400)
RBC: 5.27 MIL/uL — ABNORMAL HIGH (ref 3.87–5.11)
RDW: 14.6 % (ref 11.5–15.5)
WBC: 4.6 10*3/uL (ref 4.0–10.5)
nRBC: 0 % (ref 0.0–0.2)

## 2019-05-08 LAB — LACTATE DEHYDROGENASE: LDH: 241 U/L — ABNORMAL HIGH (ref 98–192)

## 2019-05-08 LAB — ABO/RH: ABO/RH(D): O POS

## 2019-05-08 LAB — COMPREHENSIVE METABOLIC PANEL
ALT: 18 U/L (ref 0–44)
AST: 43 U/L — ABNORMAL HIGH (ref 15–41)
Albumin: 3.1 g/dL — ABNORMAL LOW (ref 3.5–5.0)
Alkaline Phosphatase: 49 U/L (ref 38–126)
Anion gap: 13 (ref 5–15)
BUN: 23 mg/dL (ref 8–23)
CO2: 25 mmol/L (ref 22–32)
Calcium: 8.6 mg/dL — ABNORMAL LOW (ref 8.9–10.3)
Chloride: 106 mmol/L (ref 98–111)
Creatinine, Ser: 0.95 mg/dL (ref 0.44–1.00)
GFR calc Af Amer: 59 mL/min — ABNORMAL LOW (ref >60)
GFR calc non Af Amer: 51 mL/min — ABNORMAL LOW (ref >60)
Glucose, Bld: 74 mg/dL (ref 70–99)
Potassium: 3.2 mmol/L — ABNORMAL LOW (ref 3.5–5.1)
Sodium: 144 mmol/L (ref 135–145)
Total Bilirubin: 0.6 mg/dL (ref 0.3–1.2)
Total Protein: 6.9 g/dL (ref 6.5–8.1)

## 2019-05-08 LAB — BRAIN NATRIURETIC PEPTIDE: B Natriuretic Peptide: 198.4 pg/mL — ABNORMAL HIGH (ref 0.0–100.0)

## 2019-05-08 LAB — PROCALCITONIN: Procalcitonin: 0.18 ng/mL

## 2019-05-08 LAB — FERRITIN: Ferritin: 544 ng/mL — ABNORMAL HIGH (ref 11–307)

## 2019-05-08 LAB — MAGNESIUM: Magnesium: 2.2 mg/dL (ref 1.7–2.4)

## 2019-05-08 LAB — D-DIMER, QUANTITATIVE: D-Dimer, Quant: 0.82 ug/mL-FEU — ABNORMAL HIGH (ref 0.00–0.50)

## 2019-05-08 LAB — C-REACTIVE PROTEIN: CRP: 10.1 mg/dL — ABNORMAL HIGH (ref <1.0)

## 2019-05-08 MED ORDER — SODIUM CHLORIDE 0.9% FLUSH: 10.0000 mL | INTRAVENOUS | Status: DC | PRN

## 2019-05-08 MED ORDER — POTASSIUM CHLORIDE 10 MEQ/100ML IV SOLN
10.0000 meq | INTRAVENOUS | Status: DC
  Administered 2019-05-08 (×3): 10 meq via INTRAVENOUS
  Filled 2019-05-08 (×2): qty 100

## 2019-05-08 MED ORDER — POTASSIUM CHLORIDE CRYS ER 20 MEQ PO TBCR
20.0000 meq | EXTENDED_RELEASE_TABLET | Freq: Once | ORAL | Status: AC
  Administered 2019-05-08: 20 meq via ORAL
  Filled 2019-05-08: qty 1

## 2019-05-08 NOTE — Progress Notes (Signed)
Spoke with [place] and answered all her questions.

## 2019-05-08 NOTE — Progress Notes (Signed)
PROGRESS NOTE    Abigail Patel  DVV:[id number] DOB: [birth year] DOA: 05/21/2019 PCP: Renaye Rakers, MD   Brief Narrative:   85 year old with history of HTN, thrombocytopenia came to the hospital with complains of increasing weakness for the past 3 days.  Per family there is also mild confusion and.  Upon admission she was noted to be febrile with chest x-ray concerns for coarse interstitial pattern.  Ended up being diagnosed with COVID-19.  Assessment & Plan:   Principal Problem:   Fever Active Problems:   Essential hypertension   ARF (acute renal failure) (HCC)   Weakness   Thrombocytopenia (HCC)   COVID-19 virus infection  Acute kidney injury, prerenal -Unknown exact baseline.  Admission creatinine 1.4.  Improved with IVF. 0.9 appears to be her baseline.   Caution with fluid given her bilateral lower extremity mild edema.  Acute metabolic encephalopathy, improved -CT of the head negative.  Suspect possibly from underlying infection/dehydration.  Mentation almost back to baseline.  COVID-19 pneumonia without hypoxia -Inflammatory markers - stable..  We will continue to trend this. -No signs of hypoxia, hold off on Decadron/remdesivir -Supportive care.  Generalized weakness -PT/OT= rec Home Health   Essential hypertension -Continue amlodipine -Lisinopril on hold due to AKI  Thrombocytopenia, resolved -Suspect due to acute illness.  Hypokalemia= repletion ordered.   DVT prophylaxis: Subcutaneous heparin Code Status: Full code Family Communication: Will call her son.  Disposition Plan: Awaiting safe disposition planning, given her age and comorbidites she requires assistance. Son provides help at home but he is going to get covid tested.     Subjective: Awake and sitting up on her bed. No complaints. No acute events overnight.   Review of Systems Otherwise negative except as per HPI, including: General = no fevers, chills, dizziness, malaise, fatigue HEENT/EYES  = negative for pain, redness, loss of vision, double vision, blurred vision, loss of hearing, sore throat, hoarseness, dysphagia Cardiovascular= negative for chest pain, palpitation, murmurs, lower extremity swelling Respiratory/lungs= negative for shortness of breath, cough, hemoptysis, wheezing, mucus production Gastrointestinal= negative for nausea, vomiting,, abdominal pain, melena, hematemesis Genitourinary= negative for Dysuria, Hematuria, Change in Urinary Frequency MSK = Negative for arthralgia, myalgias, Back Pain, Joint swelling  Neurology= Negative for headache, seizures, numbness, tingling  Psychiatry= Negative for anxiety, depression, suicidal and homocidal ideation Allergy/Immunology= Medication/Food allergy as listed  Skin= Negative for Rash, lesions, ulcers, itching   Objective: Vitals:   05/07/19 1631 05/07/19 1920 05/08/19 0433 05/08/19 0814  BP: (!) 120/47 102/73 (!) 119/50 (!) 116/44  Pulse: 76 64 67 71  Resp: 16  20 20   Temp: 98.7 F (37.1 C) 97.6 F (36.4 C) 99 F (37.2 C) 99 F (37.2 C)  TempSrc: Oral Oral Oral Oral  SpO2: 95% 100% 91% 92%  Weight:    49.3 kg  Height:    5\' 5"  (1.651 m)    Intake/Output Summary (Last 24 hours) at 05/08/2019 1035 Last data filed at 05/08/2019 13/05/2019 Gross per 24 hour  Intake 240 ml  Output 200 ml  Net 40 ml   Filed Weights   05/08/19 0814  Weight: 49.3 kg    Examination:  Constitutional: Not in acute distress; elderly frail  Respiratory: Clear to auscultation bilaterally Cardiovascular: Normal sinus rhythm, no rubs Abdomen: Nontender nondistended good bowel sounds Musculoskeletal: No edema noted Skin: No rashes seen Neurologic: CN 2-12 grossly intact.  And nonfocal Psychiatric: Normal judgment and insight. Alert and oriented x 3. Normal mood.     External Foley catheter in  place  Data Reviewed:   CBC: Recent Labs  Lab 05/13/2019 1746 05/06/2019 2138 05/06/19 0317 05/07/19 0200 05/08/19 0425  WBC 4.6   --  4.4 5.5 4.6  NEUTROABS 3.1  --  2.4 4.1 3.2  HGB 11.8* 14.3 14.1 14.1 14.2  HCT 39.2 42.0 45.6 46.5* 46.2*  MCV 91.0  --  87.4 88.2 87.7  PLT 25*  --  227   226 213 220   Basic Metabolic Panel: Recent Labs  Lab 05/02/2019 1746 05/24/2019 2138 05/06/19 0317 05/07/19 0200 05/08/19 0425  NA 141 140 138 142 144  K 3.1* 3.1* 3.1* 3.6 3.2*  CL 111 102 104 104 106  CO2 19*  --  21* 23 25  GLUCOSE 96 101* 94 66* 74  BUN 20 23 21 17 23   CREATININE 1.28* 1.40* 1.31* 0.99 0.95  CALCIUM 7.0*  --  8.5* 8.4* 8.6*  MG  --   --   --   --  2.2   GFR: Estimated Creatinine Clearance: 27 mL/min (by C-G formula based on SCr of 0.95 mg/dL). Liver Function Tests: Recent Labs  Lab 05/07/2019 1746 05/06/19 0317 05/07/19 0200 05/08/19 0425  AST 32 39 43* 43*  ALT 12 19 20 18   ALKPHOS 42 57 52 49  BILITOT 1.0 0.6 0.5 0.6  PROT 5.3* 7.3 6.9 6.9  ALBUMIN 2.5* 3.3* 3.3* 3.1*   No results for input(s): LIPASE, AMYLASE in the last 168 hours. No results for input(s): AMMONIA in the last 168 hours. Coagulation Profile: Recent Labs  Lab 05/06/19 0317  INR 1.0   Cardiac Enzymes: No results for input(s): CKTOTAL, CKMB, CKMBINDEX, TROPONINI in the last 168 hours. BNP (last 3 results) No results for input(s): PROBNP in the last 8760 hours. HbA1C: No results for input(s): HGBA1C in the last 72 hours. CBG: Recent Labs  Lab 05/06/19 0950 05/06/19 2040  GLUCAP 93 106*   Lipid Profile: No results for input(s): CHOL, HDL, LDLCALC, TRIG, CHOLHDL, LDLDIRECT in the last 72 hours. Thyroid Function Tests: No results for input(s): TSH, T4TOTAL, FREET4, T3FREE, THYROIDAB in the last 72 hours. Anemia Panel: Recent Labs    05/07/19 0200 05/08/19 0425  FERRITIN 527* 544*   Sepsis Labs: Recent Labs  Lab 05/07/19 0200 05/08/19 0425  PROCALCITON 0.12 0.18    Recent Results (from the past 240 hour(s))  SARS CORONAVIRUS 2 (TAT 6-24 HRS) Nasopharyngeal Nasopharyngeal Swab     Status: Abnormal    Collection Time: 04/29/2019 12:37 AM   Specimen: Nasopharyngeal Swab  Result Value Ref Range Status   SARS Coronavirus 2 POSITIVE (A) NEGATIVE Final    Comment: RESULT CALLED TO, READ BACK BY AND VERIFIED WITH: TREY SwazilandJORDAN RN.@0510  ON 11.10.2020 BY TCALDWELL MT. (NOTE) SARS-CoV-2 target nucleic acids are DETECTED. The SARS-CoV-2 RNA is generally detectable in upper and lower respiratory specimens during the acute phase of infection. Positive results are indicative of active infection with SARS-CoV-2. Clinical  correlation with patient history and other diagnostic information is necessary to determine patient infection status. Positive results do  not rule out bacterial infection or co-infection with other viruses. The expected result is Negative. Fact Sheet for Patients: HairSlick.nohttps://www.fda.gov/media/138098/download Fact Sheet for Healthcare Providers: quierodirigir.comhttps://www.fda.gov/media/138095/download This test is not yet approved or cleared by the [place] FDA and  has been authorized for detection and/or diagnosis of SARS-CoV-2 by FDA under an Emergency Use Authorization (EUA). This EUA will remain  in effect (meaning this test ca n be used) for the duration of the  COVID-19 declaration under Section 564(b)(1) of the Act, 21 U.S.C. section 360bbb-3(b)(1), unless the authorization is terminated or revoked sooner. Performed at [facility], [place] [address] [place], [place] [address]          Radiology Studies: No results found.      Scheduled Meds:  amLODipine  10 mg Oral Daily   heparin injection (subcutaneous)  5,000 Units Subcutaneous Q8H   vitamin C  500 mg Oral Daily   zinc sulfate  220 mg Oral Daily   Continuous Infusions:  potassium chloride 10 mEq (05/08/19 0952)     LOS: 2 days   Time spent= 25 mins    Emiline Mancebo Arsenio Loader, MD [facility]  If 7PM-7AM, please contact night-coverage  05/08/2019, 10:35 AM

## 2019-05-08 NOTE — Evaluation (Addendum)
Occupational Therapy Evaluation Patient Details Name: Abigail Patel MRN: [id number] DOB: June 30, 1920 Today's Date: 05/08/2019    History of Present Illness 85 y/o female pt w/ hx of HTN. presented to hospital with c/o increasing weakness for 3 days. pt dx with COVID, admitted with fever, HTN, ARF, weakness, thrombocytopenia, and COVID.   Clinical Impression   This 85 y/o female presents with the above. Per chart pt ambulatory PTA, unable to reach family when attempted to obtain further PLOF/home setup today. Pt notably confused during session, requiring min-modA for bed mobility. Pt adamant to return to supine once sitting up and not able to redirect to attempt OOB activity. She was able to perform simple grooming ADL with setup/minguard assist at bed level (bed placed in chair position), suspect will require increased assist for additional UB/LB ADL given current cognitive impairments. Per chart and in speaking with RN pt has been up and mobilizing with +1 assist. She will benefit from continued acute OT services, will need to clarify with family whether pt able to have 24hr supervision/assist after discharge prior to return home. If 24hr not available may need to consider SNF. Will continue to follow acutely.    Follow Up Recommendations  SNF(vs home with 24hr supervision/assist)    Equipment Recommendations  Other (comment);3 in 1 bedside commode(to be further asssessed)           Precautions / Restrictions Precautions Precautions: Fall Restrictions Weight Bearing Restrictions: No      Mobility Bed Mobility Overal bed mobility: Needs Assistance Bed Mobility: Supine to Sit;Sit to Supine     Supine to sit: Mod assist Sit to supine: Min assist   General bed mobility comments: required assist to initiate bed mobility and for trunk elevation; pt self initiating return to supine with assist for trunk/safety; required boosting assist and mulitmodal cues to self assist for boosting  to [facility]  Transfers                 General transfer comment: unable to attempt this session as pt adamant to return to supine    Balance Overall balance assessment: Needs assistance Sitting-balance support: Feet supported;Feet unsupported Sitting balance-Leahy Scale: Fair                                     ADL either performed or assessed with clinical judgement   ADL Overall ADL's : Needs assistance/impaired Eating/Feeding: Set up;Minimal assistance;Sitting   Grooming: Set up;Min guard;Bed level;Wash/dry face                                 General ADL Comments: pt requires encouragement to participate today - able to reach EOB but then pt initiating returning herself to supine and adament to lay down; limited in ADL partly due to cognitive status at this time     Vision         Perception     Praxis      Pertinent Vitals/Pain Pain Assessment: Faces Faces Pain Scale: No hurt Pain Intervention(s): Monitored during session     Hand Dominance     Extremity/Trunk Assessment Upper Extremity Assessment Upper Extremity Assessment: Generalized weakness   Lower Extremity Assessment Lower Extremity Assessment: Defer to PT evaluation;Generalized weakness       Communication Communication Communication: HOH   Cognition Arousal/Alertness: Awake/alert Behavior During Therapy: [facility] for  tasks assessed/performed;Flat affect Overall Cognitive Status: No family/caregiver present to determine baseline cognitive functioning                                 General Comments: pt confused this session and not able to provide responses to therapist's questions, per chart has been getting up on her own mulitple times, pulling IV, lines etc off   General Comments  pt reports yes when therapist asking if she is dizzy (while sitting EOB), BP taken and 138/51, pt on RA with O2 sats stable     Exercises     Shoulder Instructions       Home Living Family/patient expects to be discharged to:: Private residence Living Arrangements: Children;Other relatives                               Additional Comments: per chart pt from home with either spouse/children      Prior Functioning/Environment Level of Independence: Needs assistance  Gait / Transfers Assistance Needed: ambulatory at baseline per chart ADL's / Homemaking Assistance Needed: suspect required at least some assist given cognitive impairments   Comments: attempted to call family for additional PLOF but did not receive answer (busy signal)        OT Problem List: Decreased strength;Decreased range of motion;Decreased activity tolerance;Decreased cognition;Decreased safety awareness;Decreased knowledge of use of DME or AE;Cardiopulmonary status limiting activity;Impaired balance (sitting and/or standing)      OT Treatment/Interventions: Self-care/ADL training;Therapeutic exercise;Neuromuscular education;Energy conservation;DME and/or AE instruction;Therapeutic activities;Patient/family education;Balance training;Cognitive remediation/compensation    OT Goals(Current goals can be found in the care plan section) Acute Rehab OT Goals Patient Stated Goal: "I want to lay down" OT Goal Formulation: With patient Time For Goal Achievement: 05/22/19 Potential to Achieve Goals: Fair  OT Frequency: Min 2X/week(may need 3x/week if going home)   Barriers to D/C:            Co-evaluation              AM-PAC OT "6 Clicks" Daily Activity     Outcome Measure Help from another person eating meals?: A Little Help from another person taking care of personal grooming?: A Little Help from another person toileting, which includes using toliet, bedpan, or urinal?: Total Help from another person bathing (including washing, rinsing, drying)?: A Lot Help from another person to put on and taking off regular upper body clothing?: A Lot Help from another  person to put on and taking off regular lower body clothing?: Total 6 Click Score: 12   End of Session Nurse Communication: Mobility status  Activity Tolerance: Patient tolerated treatment well;Other (comment)(limited due to cognition, pt insistent to return to supine) Patient left: in bed;with call bell/phone within reach;with bed alarm set  OT Visit Diagnosis: Muscle weakness (generalized) (M62.81);Other symptoms and signs involving cognitive function                Time: 1200-1227 OT Time Calculation (min): 27 min Charges:  OT General Charges $OT Visit: 1 Visit OT Evaluation $OT Eval Moderate Complexity: 1 Mod OT Treatments $Self Care/Home Management : 8-22 mins  Lou Cal, OT [facility] Pager [phone] Office [phone]  Raymondo Band 05/08/2019, 2:15 PM

## 2019-05-09 ENCOUNTER — Inpatient Hospital Stay (HOSPITAL_COMMUNITY): Payer: Medicare Other

## 2019-05-09 DIAGNOSIS — R7982 Elevated C-reactive protein (CRP): Secondary | ICD-10-CM

## 2019-05-09 LAB — CBC WITH DIFFERENTIAL/PLATELET
Abs Immature Granulocytes: 0.04 10*3/uL (ref 0.00–0.07)
Basophils Absolute: 0 10*3/uL (ref 0.0–0.1)
Basophils Relative: 0 %
Eosinophils Absolute: 0 10*3/uL (ref 0.0–0.5)
Eosinophils Relative: 0 %
HCT: 46.1 % — ABNORMAL HIGH (ref 36.0–46.0)
Hemoglobin: 14 g/dL (ref 12.0–15.0)
Immature Granulocytes: 1 %
Lymphocytes Relative: 14 %
Lymphs Abs: 0.8 10*3/uL (ref 0.7–4.0)
MCH: 26.6 pg (ref 26.0–34.0)
MCHC: 30.4 g/dL (ref 30.0–36.0)
MCV: 87.6 fL (ref 80.0–100.0)
Monocytes Absolute: 0.4 10*3/uL (ref 0.1–1.0)
Monocytes Relative: 6 %
Neutro Abs: 4.5 10*3/uL (ref 1.7–7.7)
Neutrophils Relative %: 79 %
Platelets: 222 10*3/uL (ref 150–400)
RBC: 5.26 MIL/uL — ABNORMAL HIGH (ref 3.87–5.11)
RDW: 14.6 % (ref 11.5–15.5)
WBC: 5.8 10*3/uL (ref 4.0–10.5)
nRBC: 0 % (ref 0.0–0.2)

## 2019-05-09 LAB — MAGNESIUM: Magnesium: 2.2 mg/dL (ref 1.7–2.4)

## 2019-05-09 LAB — C-REACTIVE PROTEIN: CRP: 12.6 mg/dL — ABNORMAL HIGH (ref <1.0)

## 2019-05-09 LAB — FERRITIN: Ferritin: 486 ng/mL — ABNORMAL HIGH (ref 11–307)

## 2019-05-09 LAB — COMPREHENSIVE METABOLIC PANEL
ALT: 20 U/L (ref 0–44)
AST: 43 U/L — ABNORMAL HIGH (ref 15–41)
Albumin: 3 g/dL — ABNORMAL LOW (ref 3.5–5.0)
Alkaline Phosphatase: 48 U/L (ref 38–126)
Anion gap: 12 (ref 5–15)
BUN: 18 mg/dL (ref 8–23)
CO2: 25 mmol/L (ref 22–32)
Calcium: 8.6 mg/dL — ABNORMAL LOW (ref 8.9–10.3)
Chloride: 107 mmol/L (ref 98–111)
Creatinine, Ser: 0.84 mg/dL (ref 0.44–1.00)
GFR calc Af Amer: >60 mL/min (ref >60)
GFR calc non Af Amer: 59 mL/min — ABNORMAL LOW (ref >60)
Glucose, Bld: 96 mg/dL (ref 70–99)
Potassium: 4.3 mmol/L (ref 3.5–5.1)
Sodium: 144 mmol/L (ref 135–145)
Total Bilirubin: 0.7 mg/dL (ref 0.3–1.2)
Total Protein: 7.1 g/dL (ref 6.5–8.1)

## 2019-05-09 LAB — BRAIN NATRIURETIC PEPTIDE: B Natriuretic Peptide: 155.9 pg/mL — ABNORMAL HIGH (ref 0.0–100.0)

## 2019-05-09 LAB — LACTATE DEHYDROGENASE: LDH: 258 U/L — ABNORMAL HIGH (ref 98–192)

## 2019-05-09 LAB — D-DIMER, QUANTITATIVE: D-Dimer, Quant: 0.45 ug/mL-FEU (ref 0.00–0.50)

## 2019-05-09 MED ORDER — DEXAMETHASONE SODIUM PHOSPHATE 10 MG/ML IJ SOLN
8.0000 mg | INTRAMUSCULAR | Status: DC
  Administered 2019-05-09 – 2019-05-10 (×2): 8 mg via INTRAVENOUS
  Filled 2019-05-09 (×2): qty 1

## 2019-05-09 MED ORDER — DEXAMETHASONE SODIUM PHOSPHATE 4 MG/ML IJ SOLN: 4.0000 mg | INTRAMUSCULAR | Status: DC

## 2019-05-09 NOTE — Progress Notes (Signed)
PROGRESS NOTE    Abigail Patel  ZOX:[id number] DOB: 1920-10-13 DOA: 05/10/2019 PCP: Abigail RakersBland, Veita, MD   Brief Narrative:   85 year old with history of HTN, thrombocytopenia came to the hospital with complains of increasing weakness for the past 3 days.  Per family there is also mild confusion and.  Upon admission she was noted to be febrile with chest x-ray concerns for coarse interstitial pattern.  Ended up being diagnosed with COVID-19.  Assessment & Plan:   Principal Problem:   Fever Active Problems:   Essential hypertension   ARF (acute renal failure) (HCC)   Weakness   Thrombocytopenia (HCC)   COVID-19 virus infection  COVID-19 pneumonia NOW WITH Hypoxia.  -Oxygen levels- 2l [place] -Remdesivir- N/A -Decadron- started D1 -Routine: Labs have been reviewed including ferritin, LDH, CRP, d-dimer, fibrinogen.  Will need to trend this lab daily. -Vitamin C & Zinc. Prone >16hrs/day.  -Procalcitonin- 0.18 -Chest x-ray 11/10-chronic atrophy and ischemic changes. -Obtain repeat chest x-ray, if infiltrate seen-we will start IV remdesivir -Supportive care-antitussive, inhalers, I-S/flutter -CODE STATUS confirmed  Acute kidney injury, prerenal -Unknown exact baseline.  Admission creatinine 1.4.  Improved.  This morning to 0.8.  Acute metabolic encephalopathy, improved -CT of the head negative.  Suspect possibly from underlying infection/dehydration.  Mentation almost back to baseline.  Generalized weakness -PT/OT= rec Home Health   Essential hypertension -Continue amlodipine -Continue to hold lisinopril, blood pressure is fairly stable  Thrombocytopenia, resolved -Suspect due to acute illness.  Hypokalemia= repletion ordered.   Moderate to severe protein calorie malnutrition-encourage p.o. diet  DVT prophylaxis: Subcutaneous heparin Code Status: Full code Family Communication: Son Abigail Patel did not answer therefore spoke with Abigail Patel Disposition Plan: She is now hypoxic this  morning therefore on IV steroids.  Subjective: No complaints this morning.  Review of Systems Otherwise negative except as per HPI, including: General = no fevers, chills, dizziness, malaise, fatigue HEENT/EYES = negative for pain, redness, loss of vision, double vision, blurred vision, loss of hearing, sore throat, hoarseness, dysphagia Cardiovascular= negative for chest pain, palpitation, murmurs, lower extremity swelling Respiratory/lungs= negative for shortness of breath, cough, hemoptysis, wheezing, mucus production Gastrointestinal= negative for nausea, vomiting,, abdominal pain, melena, hematemesis Genitourinary= negative for Dysuria, Hematuria, Change in Urinary Frequency MSK = Negative for arthralgia, myalgias, Back Pain, Joint swelling  Neurology= Negative for headache, seizures, numbness, tingling  Psychiatry= Negative for anxiety, depression, suicidal and homocidal ideation Allergy/Immunology= Medication/Food allergy as listed  Skin= Negative for Rash, lesions, ulcers, itching  Objective: Vitals:   05/08/19 2001 05/08/19 2004 05/09/19 0427 05/09/19 0719  BP: 140/66 140/66 (!) 126/53   Pulse: 98 77 73 76  Resp: 19 19 18    Temp: 99.5 F (37.5 C)  98.5 F (36.9 C) 98.1 F (36.7 C)  TempSrc: Oral  Oral Oral  SpO2: 91% 90% 93% 93%  Weight:      Height:       No intake or output data in the 24 hours ending 05/09/19 1043 Filed Weights   05/08/19 0814  Weight: 49.3 kg    Examination:  Constitutional: Not in acute distress, 2 L nasal cannula.  Elderly frail. B/l temporal wasting.  Respiratory: Minimal bibasilar rhonchi Cardiovascular: Normal sinus rhythm, no rubs Abdomen: Nontender nondistended good bowel sounds Musculoskeletal: No edema noted Skin: No rashes seen Neurologic: CN 2-12 grossly intact.  And nonfocal Psychiatric: Normal judgment and insight. Alert and oriented x 3. Normal mood.     External Foley catheter in place  Data Reviewed:   CBC: Recent  Labs  Lab June 01, 2019 1746 06-01-19 2138 05/06/19 0317 05/07/19 0200 05/08/19 0425 05/09/19 0132  WBC 4.6  --  4.4 5.5 4.6 5.8  NEUTROABS 3.1  --  2.4 4.1 3.2 4.5  HGB 11.8* 14.3 14.1 14.1 14.2 14.0  HCT 39.2 42.0 45.6 46.5* 46.2* 46.1*  MCV 91.0  --  87.4 88.2 87.7 87.6  PLT 25*  --  227  226 213 220 222   Basic Metabolic Panel: Recent Labs  Lab Jun 01, 2019 1746 06-01-2019 2138 05/06/19 0317 05/07/19 0200 05/08/19 0425 05/09/19 0132  NA 141 140 138 142 144 144  K 3.1* 3.1* 3.1* 3.6 3.2* 4.3  CL 111 102 104 104 106 107  CO2 19*  --  21* 23 25 25   GLUCOSE 96 101* 94 66* 74 96  BUN 20 23 21 17 23 18   CREATININE 1.28* 1.40* 1.31* 0.99 0.95 0.84  CALCIUM 7.0*  --  8.5* 8.4* 8.6* 8.6*  MG  --   --   --   --  2.2 2.2   GFR: Estimated Creatinine Clearance: 30.5 mL/min (by C-G formula based on SCr of 0.84 mg/dL). Liver Function Tests: Recent Labs  Lab 06/01/2019 1746 05/06/19 0317 05/07/19 0200 05/08/19 0425 05/09/19 0132  AST 32 39 43* 43* 43*  ALT 12 19 20 18 20   ALKPHOS 42 57 52 49 48  BILITOT 1.0 0.6 0.5 0.6 0.7  PROT 5.3* 7.3 6.9 6.9 7.1  ALBUMIN 2.5* 3.3* 3.3* 3.1* 3.0*   No results for input(s): LIPASE, AMYLASE in the last 168 hours. No results for input(s): AMMONIA in the last 168 hours. Coagulation Profile: Recent Labs  Lab 05/06/19 0317  INR 1.0   Cardiac Enzymes: No results for input(s): CKTOTAL, CKMB, CKMBINDEX, TROPONINI in the last 168 hours. BNP (last 3 results) No results for input(s): PROBNP in the last 8760 hours. HbA1C: No results for input(s): HGBA1C in the last 72 hours. CBG: Recent Labs  Lab 05/06/19 0950 05/06/19 2040  GLUCAP 93 106*   Lipid Profile: No results for input(s): CHOL, HDL, LDLCALC, TRIG, CHOLHDL, LDLDIRECT in the last 72 hours. Thyroid Function Tests: No results for input(s): TSH, T4TOTAL, FREET4, T3FREE, THYROIDAB in the last 72 hours. Anemia Panel: Recent Labs    05/08/19 0425 05/09/19 0132  FERRITIN 544* 486*    Sepsis Labs: Recent Labs  Lab 05/07/19 0200 05/08/19 0425  PROCALCITON 0.12 0.18    Recent Results (from the past 240 hour(s))  SARS CORONAVIRUS 2 (TAT 6-24 HRS) Nasopharyngeal Nasopharyngeal Swab     Status: Abnormal   Collection Time: 06-01-2019 12:37 AM   Specimen: Nasopharyngeal Swab  Result Value Ref Range Status   SARS Coronavirus 2 POSITIVE (A) NEGATIVE Final    Comment: RESULT CALLED TO, READ BACK BY AND VERIFIED WITH: TREY 13/11/20 RN.@0510  ON 11.10.2020 BY TCALDWELL MT. (NOTE) SARS-CoV-2 target nucleic acids are DETECTED. The SARS-CoV-2 RNA is generally detectable in upper and lower respiratory specimens during the acute phase of infection. Positive results are indicative of active infection with SARS-CoV-2. Clinical  correlation with patient history and other diagnostic information is necessary to determine patient infection status. Positive results do  not rule out bacterial infection or co-infection with other viruses. The expected result is Negative. Fact Sheet for Patients: 13/09/20 Fact Sheet for Healthcare Providers: [place] This test is not yet approved or cleared by the 13.10.2020 FDA and  has been authorized for detection and/or diagnosis of SARS-CoV-2 by FDA under an Emergency Use Authorization (EUA). This EUA  will remain  in effect (meaning this test ca n be used) for the duration of the COVID-19 declaration under Section 564(b)(1) of the Act, 21 U.S.C. section 360bbb-3(b)(1), unless the authorization is terminated or revoked sooner. Performed at [facility], [place] [address] [place], [place] [address]          Radiology Studies: No results found.      Scheduled Meds: . amLODipine  10 mg Oral Daily  . dexamethasone (DECADRON) injection  8 mg Intravenous Q24H  . heparin injection (subcutaneous)  5,000 Units Subcutaneous Q8H  . vitamin C  500 mg Oral Daily  . zinc  sulfate  220 mg Oral Daily   Continuous Infusions:    LOS: 3 days   Time spent= 35 mins     Arsenio Loader, MD [facility]  If 7PM-7AM, please contact night-coverage  05/09/2019, 10:43 AM

## 2019-05-10 LAB — CBC WITH DIFFERENTIAL/PLATELET
Abs Immature Granulocytes: 0.02 10*3/uL (ref 0.00–0.07)
Basophils Absolute: 0 10*3/uL (ref 0.0–0.1)
Basophils Relative: 0 %
Eosinophils Absolute: 0 10*3/uL (ref 0.0–0.5)
Eosinophils Relative: 0 %
HCT: 47.2 % — ABNORMAL HIGH (ref 36.0–46.0)
Hemoglobin: 14.4 g/dL (ref 12.0–15.0)
Immature Granulocytes: 0 %
Lymphocytes Relative: 13 %
Lymphs Abs: 0.6 10*3/uL — ABNORMAL LOW (ref 0.7–4.0)
MCH: 26.8 pg (ref 26.0–34.0)
MCHC: 30.5 g/dL (ref 30.0–36.0)
MCV: 87.7 fL (ref 80.0–100.0)
Monocytes Absolute: 0.4 10*3/uL (ref 0.1–1.0)
Monocytes Relative: 8 %
Neutro Abs: 3.8 10*3/uL (ref 1.7–7.7)
Neutrophils Relative %: 79 %
Platelets: 234 10*3/uL (ref 150–400)
RBC: 5.38 MIL/uL — ABNORMAL HIGH (ref 3.87–5.11)
RDW: 14.4 % (ref 11.5–15.5)
WBC: 4.8 10*3/uL (ref 4.0–10.5)
nRBC: 0 % (ref 0.0–0.2)

## 2019-05-10 LAB — COMPREHENSIVE METABOLIC PANEL
ALT: 19 U/L (ref 0–44)
AST: 36 U/L (ref 15–41)
Albumin: 2.8 g/dL — ABNORMAL LOW (ref 3.5–5.0)
Alkaline Phosphatase: 51 U/L (ref 38–126)
Anion gap: 11 (ref 5–15)
BUN: 28 mg/dL — ABNORMAL HIGH (ref 8–23)
CO2: 27 mmol/L (ref 22–32)
Calcium: 8.9 mg/dL (ref 8.9–10.3)
Chloride: 107 mmol/L (ref 98–111)
Creatinine, Ser: 0.79 mg/dL (ref 0.44–1.00)
GFR calc Af Amer: >60 mL/min (ref >60)
GFR calc non Af Amer: >60 mL/min (ref >60)
Glucose, Bld: 165 mg/dL — ABNORMAL HIGH (ref 70–99)
Potassium: 3.8 mmol/L (ref 3.5–5.1)
Sodium: 145 mmol/L (ref 135–145)
Total Bilirubin: 0.5 mg/dL (ref 0.3–1.2)
Total Protein: 7 g/dL (ref 6.5–8.1)

## 2019-05-10 LAB — GLUCOSE, CAPILLARY
Glucose-Capillary: 204 mg/dL — ABNORMAL HIGH (ref 70–99)
Glucose-Capillary: 182 mg/dL — ABNORMAL HIGH (ref 70–99)

## 2019-05-10 LAB — MAGNESIUM: Magnesium: 2.3 mg/dL (ref 1.7–2.4)

## 2019-05-10 LAB — C-REACTIVE PROTEIN: CRP: 12.3 mg/dL — ABNORMAL HIGH (ref <1.0)

## 2019-05-10 LAB — LACTATE DEHYDROGENASE: LDH: 284 U/L — ABNORMAL HIGH (ref 98–192)

## 2019-05-10 LAB — FERRITIN: Ferritin: 678 ng/mL — ABNORMAL HIGH (ref 11–307)

## 2019-05-10 LAB — D-DIMER, QUANTITATIVE: D-Dimer, Quant: 0.44 ug{FEU}/mL (ref 0.00–0.50)

## 2019-05-10 MED ORDER — METHYLPREDNISOLONE SODIUM SUCC 40 MG IJ SOLR
40.0000 mg | Freq: Two times a day (BID) | INTRAMUSCULAR | Status: DC
  Administered 2019-05-10 – 2019-05-13 (×6): 40 mg via INTRAVENOUS
  Filled 2019-05-10 (×7): qty 1

## 2019-05-10 MED ORDER — BOOST / RESOURCE BREEZE PO LIQD CUSTOM
1.0000 | Freq: Three times a day (TID) | ORAL | Status: DC
  Administered 2019-05-12 – 2019-05-13 (×5): 1 via ORAL
  Filled 2019-05-10 (×16): qty 1

## 2019-05-10 NOTE — Plan of Care (Signed)
  Problem: Education: Goal: Knowledge of General Education information will improve Description: Including pain rating scale, medication(s)/side effects and non-pharmacologic comfort measures Outcome: Progressing   Problem: Health Behavior/Discharge Planning: Goal: Ability to manage health-related needs will improve Outcome: Progressing   Problem: Clinical Measurements: Goal: Ability to maintain clinical measurements within normal limits will improve Outcome: Progressing Goal: Will remain free from infection Outcome: Progressing Goal: Diagnostic test results will improve Outcome: Progressing Goal: Respiratory complications will improve 05/10/2019 0601 by Val Eagle, RN Outcome: Progressing 05/10/2019 0558 by Val Eagle, RN Outcome: Progressing Goal: Cardiovascular complication will be avoided 05/10/2019 0601 by Val Eagle, RN Outcome: Progressing 05/10/2019 0558 by Val Eagle, RN Outcome: Progressing   Problem: Activity: Goal: Risk for activity intolerance will decrease 05/10/2019 0601 by Val Eagle, RN Outcome: Progressing 05/10/2019 0558 by Val Eagle, RN Outcome: Progressing   Problem: Coping: Goal: Level of anxiety will decrease 05/10/2019 0601 by Val Eagle, RN Outcome: Progressing 05/10/2019 0558 by Val Eagle, RN Outcome: Progressing   Problem: Elimination: Goal: Will not experience complications related to bowel motility 05/10/2019 0601 by Val Eagle, RN Outcome: Progressing 05/10/2019 0558 by Val Eagle, RN Outcome: Progressing Goal: Will not experience complications related to urinary retention 05/10/2019 0601 by Val Eagle, RN Outcome: Progressing 05/10/2019 0558 by Val Eagle, RN Outcome: Progressing   Problem: Pain Managment: Goal: General experience of comfort will improve 05/10/2019 0601 by Val Eagle, RN Outcome:  Progressing 05/10/2019 0558 by Val Eagle, RN Outcome: Progressing   Problem: Safety: Goal: Ability to remain free from injury will improve 05/10/2019 0601 by Val Eagle, RN Outcome: Progressing 05/10/2019 0558 by Val Eagle, RN Outcome: Progressing   Problem: Skin Integrity: Goal: Risk for impaired skin integrity will decrease Outcome: Progressing   Problem: Education: Goal: Knowledge of risk factors and measures for prevention of condition will improve Outcome: Progressing   Problem: Coping: Goal: Psychosocial and spiritual needs will be supported 05/10/2019 0601 by Val Eagle, RN Outcome: Progressing 05/10/2019 0558 by Val Eagle, RN Outcome: Progressing   Problem: Respiratory: Goal: Will maintain a patent airway Outcome: Progressing Goal: Complications related to the disease process, condition or treatment will be avoided or minimized Outcome: Progressing   Problem: Urinary Elimination: Goal: Signs and symptoms of infection will decrease Outcome: Progressing

## 2019-05-10 NOTE — Plan of Care (Addendum)
Less confuse this shift. Still incontinent of stool. Purewick in situ.

## 2019-05-10 NOTE — Progress Notes (Signed)
PROGRESS NOTE    Abigail Patel  HQP:[id number] DOB: July 23, 1920 DOA: May 24, 2019 PCP: Renaye Rakers, MD   Brief Narrative:   85 year old with history of HTN, thrombocytopenia came to the hospital with complains of increasing weakness for the past 3 days.  Per family there is also mild confusion and.  Upon admission she was noted to be febrile with chest x-ray concerns for coarse interstitial pattern.  Ended up being diagnosed with COVID-19.  Assessment & Plan:   Principal Problem:   Fever Active Problems:   Essential hypertension   ARF (acute renal failure) (HCC)   Weakness   Thrombocytopenia (HCC)   COVID-19 virus infection  COVID-19 pneumonia  -Oxygen levels- Room Air. But inflammatory markers trending upwards.  -IV solumedrol.  -Routine: Labs have been reviewed including ferritin, LDH, CRP, d-dimer, fibrinogen.  Will need to trend this lab daily. -Vitamin C & Zinc. Prone >16hrs/day.  -Procalcitonin- 0.18 -Supportive care-antitussive, inhalers, I-S/flutter -CODE STATUS confirmed  Acute kidney injury, prerenal -Unknown exact baseline.  Admission creatinine 1.4.  Improved.  This morning to 0.79  Acute metabolic encephalopathy, improved -CT of the head negative.  Suspect possibly from underlying infection/dehydration.  Mentation almost back to baseline.  Generalized weakness -PT/OT= rec Home Health   Essential hypertension -Continue amlodipine -Continue to hold lisinopril, blood pressure is fairly stable  Thrombocytopenia, resolved -Suspect due to acute illness.  Hypokalemia= repletion ordered.   Moderate to severe protein calorie malnutrition-encourage p.o. diet  DVT prophylaxis: Subcutaneous heparin Code Status: Full code Family Communication: Spoke with [name] Disposition Plan: Difficult disposition as her son is tested positive for COVID-19 and might be getting admitted to the hospital.  He also happens to be her care provider at home.  Subjective: Poor oral  intake otherwise no complaints  Review of Systems Otherwise negative except as per HPI, including: General = no fevers, chills, dizziness, malaise, fatigue HEENT/EYES = negative for pain, redness, loss of vision, double vision, blurred vision, loss of hearing, sore throat, hoarseness, dysphagia Cardiovascular= negative for chest pain, palpitation, murmurs, lower extremity swelling Respiratory/lungs= negative for shortness of breath, cough, hemoptysis, wheezing, mucus production Gastrointestinal= negative for nausea, vomiting,, abdominal pain, melena, hematemesis Genitourinary= negative for Dysuria, Hematuria, Change in Urinary Frequency MSK = Negative for arthralgia, myalgias, Back Pain, Joint swelling  Neurology= Negative for headache, seizures, numbness, tingling  Psychiatry= Negative for anxiety, depression, suicidal and homocidal ideation Allergy/Immunology= Medication/Food allergy as listed  Skin= Negative for Rash, lesions, ulcers, itching   Objective: Vitals:   05/09/19 1922 05/10/19 0345 05/10/19 0735 05/10/19 1000  BP: 126/65 (!) 133/56 135/71   Pulse: 67 64 68 81  Resp: 18 17 (!) 21 18  Temp: 98.8 F (37.1 C) 97.7 F (36.5 C) 98 F (36.7 C)   TempSrc: Oral Oral Oral   SpO2: 90% 92% 93% 93%  Weight:      Height:        Intake/Output Summary (Last 24 hours) at 05/10/2019 1308 Last data filed at 05/10/2019 0500 Gross per 24 hour  Intake 720 ml  Output 1000 ml  Net -280 ml   Filed Weights   05/08/19 0814  Weight: 49.3 kg    Examination:  Constitutional: Not in acute distress, elderly frail Respiratory: Clear to auscultation bilaterally Cardiovascular: Normal sinus rhythm, no rubs Abdomen: Nontender nondistended good bowel sounds Musculoskeletal: No edema noted Skin: No rashes seen Neurologic: CN 2-12 grossly intact.  And nonfocal Psychiatric: Normal judgment and insight. Alert and oriented x 3. Normal mood.  External Foley catheter in place  Data  Reviewed:   CBC: Recent Labs  Lab 05/06/19 0317 05/07/19 0200 05/08/19 0425 05/09/19 0132 05/10/19 0346  WBC 4.4 5.5 4.6 5.8 4.8  NEUTROABS 2.4 4.1 3.2 4.5 3.8  HGB 14.1 14.1 14.2 14.0 14.4  HCT 45.6 46.5* 46.2* 46.1* 47.2*  MCV 87.4 88.2 87.7 87.6 87.7  PLT 227  226 213 220 222 234   Basic Metabolic Panel: Recent Labs  Lab 05/06/19 0317 05/07/19 0200 05/08/19 0425 05/09/19 0132 05/10/19 0346  NA 138 142 144 144 145  K 3.1* 3.6 3.2* 4.3 3.8  CL 104 104 106 107 107  CO2 21* 23 25 25 27   GLUCOSE 94 66* 74 96 165*  BUN 21 17 23 18  28*  CREATININE 1.31* 0.99 0.95 0.84 0.79  CALCIUM 8.5* 8.4* 8.6* 8.6* 8.9  MG  --   --  2.2 2.2 2.3   GFR: Estimated Creatinine Clearance: 32 mL/min (by C-G formula based on SCr of 0.79 mg/dL). Liver Function Tests: Recent Labs  Lab 05/06/19 0317 05/07/19 0200 05/08/19 0425 05/09/19 0132 05/10/19 0346  AST 39 43* 43* 43* 36  ALT 19 20 18 20 19   ALKPHOS 57 52 49 48 51  BILITOT 0.6 0.5 0.6 0.7 0.5  PROT 7.3 6.9 6.9 7.1 7.0  ALBUMIN 3.3* 3.3* 3.1* 3.0* 2.8*   No results for input(s): LIPASE, AMYLASE in the last 168 hours. No results for input(s): AMMONIA in the last 168 hours. Coagulation Profile: Recent Labs  Lab 05/06/19 0317  INR 1.0   Cardiac Enzymes: No results for input(s): CKTOTAL, CKMB, CKMBINDEX, TROPONINI in the last 168 hours. BNP (last 3 results) No results for input(s): PROBNP in the last 8760 hours. HbA1C: No results for input(s): HGBA1C in the last 72 hours. CBG: Recent Labs  Lab 05/06/19 0950 05/06/19 2040  GLUCAP 93 106*   Lipid Profile: No results for input(s): CHOL, HDL, LDLCALC, TRIG, CHOLHDL, LDLDIRECT in the last 72 hours. Thyroid Function Tests: No results for input(s): TSH, T4TOTAL, FREET4, T3FREE, THYROIDAB in the last 72 hours. Anemia Panel: Recent Labs    05/09/19 0132 05/10/19 0346  FERRITIN 486* 678*   Sepsis Labs: Recent Labs  Lab 05/07/19 0200 05/08/19 0425  PROCALCITON 0.12  0.18    Recent Results (from the past 240 hour(s))  SARS CORONAVIRUS 2 (TAT 6-24 HRS) Nasopharyngeal Nasopharyngeal Swab     Status: Abnormal   Collection Time: 05/11/2019 12:37 AM   Specimen: Nasopharyngeal Swab  Result Value Ref Range Status   SARS Coronavirus 2 POSITIVE (A) NEGATIVE Final    Comment: RESULT CALLED TO, READ BACK BY AND VERIFIED WITH: TREY SwazilandJORDAN RN.@0510  ON 11.10.2020 BY TCALDWELL MT. (NOTE) SARS-CoV-2 target nucleic acids are DETECTED. The SARS-CoV-2 RNA is generally detectable in upper and lower respiratory specimens during the acute phase of infection. Positive results are indicative of active infection with SARS-CoV-2. Clinical  correlation with patient history and other diagnostic information is necessary to determine patient infection status. Positive results do  not rule out bacterial infection or co-infection with other viruses. The expected result is Negative. Fact Sheet for Patients: HairSlick.nohttps://www.fda.gov/media/138098/download Fact Sheet for Healthcare Providers: quierodirigir.comhttps://www.fda.gov/media/138095/download This test is not yet approved or cleared by the [place] FDA and  has been authorized for detection and/or diagnosis of SARS-CoV-2 by FDA under an Emergency Use Authorization (EUA). This EUA will remain  in effect (meaning this test ca n be used) for the duration of the COVID-19 declaration under Section 564(b)(1)  of the Act, 21 U.S.C. section 360bbb-3(b)(1), unless the authorization is terminated or revoked sooner. Performed at [facility], [place] [address] [place], [place] [address]          Radiology Studies: Dg Chest Port 1 View  Result Date: 05/09/2019 CLINICAL DATA:  Shortness of breath. EXAM: PORTABLE CHEST 1 VIEW COMPARISON:  Chest x-ray 11-May-2019. FINDINGS: Patient rotated to the right. Trachea shifted to the right, this may be secondary to prominent thoracic aorta and patient rotation. No interim change. Heart size stable.  Mild bibasilar atelectasis/infiltrates. No pleural effusion or pneumothorax. IMPRESSION: Mild bibasilar atelectasis/infiltrates. Electronically Signed   By: [place]   On: 05/09/2019 11:59        Scheduled Meds: . amLODipine  10 mg Oral Daily  . dexamethasone (DECADRON) injection  8 mg Intravenous Q24H  . heparin injection (subcutaneous)  5,000 Units Subcutaneous Q8H  . vitamin C  500 mg Oral Daily  . zinc sulfate  220 mg Oral Daily   Continuous Infusions:    LOS: 4 days   Time spent= 35 mins    Ankit Arsenio Loader, MD [facility]  If 7PM-7AM, please contact night-coverage  05/10/2019, 1:08 PM

## 2019-05-10 NOTE — Progress Notes (Addendum)
Called patient's son Abigail Patel. Notified that patient's son Abigail Patel is ill and unable to get update at this time. Patient's daughter Abigail Patel called for update. Patient's daughter states that Abigail Patel is normally continent, oriented and independent and may need to stay at a facility at discharge. Dr Reesa Chew paged and notified. Also spoke to patient's daughter Abigail Patel who called directly. Stated that her mom may have some early dementia but agrees that she is normally not confused and is able to walk and use the bathroom on her own.   13:57 Patient is refusing oral care. Patient is refusing to eat lunch other than 3 bites of pudding. Patient stated she was tired.    17:13 Paged Dr Reesa Chew regarding patient's decreased UOP and bladder scan results  18:00 Patient had large amount of incontinent urine prior to be straight cathed. Linen changed. Straight cath preformed. 150mL of urine obtained. Patient refused to eat more than a few bites of magic cup. Patient did drink half a cup of water.

## 2019-05-11 ENCOUNTER — Inpatient Hospital Stay (HOSPITAL_COMMUNITY): Payer: Medicare Other

## 2019-05-11 DIAGNOSIS — Z7189 Other specified counseling: Secondary | ICD-10-CM

## 2019-05-11 DIAGNOSIS — J9621 Acute and chronic respiratory failure with hypoxia: Secondary | ICD-10-CM

## 2019-05-11 LAB — D-DIMER, QUANTITATIVE: D-Dimer, Quant: 0.45 ug/mL-FEU (ref 0.00–0.50)

## 2019-05-11 LAB — COMPREHENSIVE METABOLIC PANEL
ALT: 21 U/L (ref 0–44)
AST: 35 U/L (ref 15–41)
Albumin: 3 g/dL — ABNORMAL LOW (ref 3.5–5.0)
Alkaline Phosphatase: 53 U/L (ref 38–126)
Anion gap: 11 (ref 5–15)
BUN: 29 mg/dL — ABNORMAL HIGH (ref 8–23)
CO2: 27 mmol/L (ref 22–32)
Calcium: 9 mg/dL (ref 8.9–10.3)
Chloride: 108 mmol/L (ref 98–111)
Creatinine, Ser: 0.77 mg/dL (ref 0.44–1.00)
GFR calc Af Amer: >60 mL/min (ref >60)
GFR calc non Af Amer: >60 mL/min (ref >60)
Glucose, Bld: 174 mg/dL — ABNORMAL HIGH (ref 70–99)
Potassium: 3.8 mmol/L (ref 3.5–5.1)
Sodium: 146 mmol/L — ABNORMAL HIGH (ref 135–145)
Total Bilirubin: 0.3 mg/dL (ref 0.3–1.2)
Total Protein: 7.4 g/dL (ref 6.5–8.1)

## 2019-05-11 LAB — CBC WITH DIFFERENTIAL/PLATELET
Abs Immature Granulocytes: 0.05 10*3/uL (ref 0.00–0.07)
Basophils Absolute: 0 10*3/uL (ref 0.0–0.1)
Basophils Relative: 0 %
Eosinophils Absolute: 0 10*3/uL (ref 0.0–0.5)
Eosinophils Relative: 0 %
HCT: 47.4 % — ABNORMAL HIGH (ref 36.0–46.0)
Hemoglobin: 14.6 g/dL (ref 12.0–15.0)
Immature Granulocytes: 1 %
Lymphocytes Relative: 5 %
Lymphs Abs: 0.6 10*3/uL — ABNORMAL LOW (ref 0.7–4.0)
MCH: 26.6 pg (ref 26.0–34.0)
MCHC: 30.8 g/dL (ref 30.0–36.0)
MCV: 86.5 fL (ref 80.0–100.0)
Monocytes Absolute: 0.4 10*3/uL (ref 0.1–1.0)
Monocytes Relative: 4 %
Neutro Abs: 9.1 10*3/uL — ABNORMAL HIGH (ref 1.7–7.7)
Neutrophils Relative %: 90 %
Platelets: 309 10*3/uL (ref 150–400)
RBC: 5.48 MIL/uL — ABNORMAL HIGH (ref 3.87–5.11)
RDW: 14.3 % (ref 11.5–15.5)
WBC: 10.2 10*3/uL (ref 4.0–10.5)
nRBC: 0 % (ref 0.0–0.2)

## 2019-05-11 LAB — LACTATE DEHYDROGENASE: LDH: 302 U/L — ABNORMAL HIGH (ref 98–192)

## 2019-05-11 LAB — C-REACTIVE PROTEIN: CRP: 5.5 mg/dL — ABNORMAL HIGH (ref <1.0)

## 2019-05-11 LAB — GLUCOSE, CAPILLARY
Glucose-Capillary: 166 mg/dL — ABNORMAL HIGH (ref 70–99)
Glucose-Capillary: 158 mg/dL — ABNORMAL HIGH (ref 70–99)
Glucose-Capillary: 187 mg/dL — ABNORMAL HIGH (ref 70–99)
Glucose-Capillary: 203 mg/dL — ABNORMAL HIGH (ref 70–99)

## 2019-05-11 LAB — MAGNESIUM: Magnesium: 2.2 mg/dL (ref 1.7–2.4)

## 2019-05-11 LAB — FERRITIN: Ferritin: 637 ng/mL — ABNORMAL HIGH (ref 11–307)

## 2019-05-11 MED ORDER — SODIUM CHLORIDE 0.9 % IV SOLN
100.0000 mg | INTRAVENOUS | Status: DC
  Administered 2019-05-12 – 2019-05-13 (×2): 100 mg via INTRAVENOUS
  Filled 2019-05-11 (×2): qty 100

## 2019-05-11 MED ORDER — SODIUM CHLORIDE 0.9 % IV SOLN
200.0000 mg | Freq: Once | INTRAVENOUS | Status: AC
  Administered 2019-05-11: 200 mg via INTRAVENOUS
  Filled 2019-05-11: qty 40

## 2019-05-11 MED ORDER — SODIUM CHLORIDE 0.9 % IV BOLUS
500.0000 mL | Freq: Once | INTRAVENOUS | Status: AC
  Administered 2019-05-11: 500 mL via INTRAVENOUS

## 2019-05-11 NOTE — Progress Notes (Signed)
Patient will not eat. Called multiple family members to talk to patient. Facetimed daughter to encourage patient to eat. Patient continues to refuse to eat or wear oxygen. Patient tells staff to leave. Patient does not appear to be in any distress. Patient denies pain. Asked patient if she wanted something particular to eat, patient declined. Paged MD regarding patients continued refusal to wear O2, eat, or take medications. No new orders obtained.

## 2019-05-11 NOTE — Progress Notes (Signed)
PROGRESS NOTE    Abigail Patel  JGG:[id number] DOB: [birth year] DOA: 25-May-2019 PCP: Renaye Rakers, MD   Brief Narrative:   85 year old with history of HTN, thrombocytopenia came to the hospital with complains of increasing weakness for the past 3 days.  Per family there is also mild confusion and.  Upon admission she was noted to be febrile with chest x-ray concerns for coarse interstitial pattern.  Ended up being diagnosed with COVID-19.  Assessment & Plan:   Principal Problem:   Fever Active Problems:   Essential hypertension   ARF (acute renal failure) (HCC)   Weakness   Thrombocytopenia (HCC)   COVID-19 virus infection   Goals of care, counseling/discussion  COVID-19 pneumonia now with hypoxia 86% on RA -Oxygen levels- now on 2L [place] -IV solumedrol. Start Remdesivir -Routine: Labs have been reviewed including ferritin, LDH, CRP, d-dimer, fibrinogen.  Will need to trend this lab daily. -Vitamin C & Zinc. Prone >16hrs/day.  -Procalcitonin- 0.18 -Supportive care-antitussive, inhalers, I-S/flutter -CODE STATUS confirmed  Acute kidney injury, prerenal -Unknown exact baseline.  Admission creatinine 1.4.  Improved.  This morning to 0.77  Acute metabolic encephalopathy, improved -CT of the head negative.  Suspect possibly from underlying infection/dehydration.  Mentation almost back to baseline.  Generalized weakness -PT/OT= rec Home Health; due to her caretakers unable to provider her care anymore will reconsult pt/ot  Essential hypertension -Continue amlodipine -Continue to hold lisinopril, blood pressure is fairly stable  Thrombocytopenia, resolved -Suspect due to acute illness.  Hypokalemia= repletion ordered.   Moderate to severe protein calorie malnutrition with low urine output. -encourage p.o. diet. Will order 500cc NS bolus. Make her diet regular.   GOC discussion, will order Palliative care consult.   DVT prophylaxis: Subcutaneous heparin Code Status: Full  code Family Communication: Will call her family again today Disposition Plan: Start remdesivir due to hypoxia.   Subjective: Still continues to have poor po intake. Low urine output.   Review of Systems Otherwise negative except as per HPI, including: General = no fevers, chills, dizziness, malaise, fatigue HEENT/EYES = negative for pain, redness, loss of vision, double vision, blurred vision, loss of hearing, sore throat, hoarseness, dysphagia Cardiovascular= negative for chest pain, palpitation, murmurs, lower extremity swelling Respiratory/lungs= negative for shortness of breath, cough, hemoptysis, wheezing, mucus production Gastrointestinal= negative for nausea, vomiting,, abdominal pain, melena, hematemesis Genitourinary= negative for Dysuria, Hematuria, Change in Urinary Frequency MSK = Negative for arthralgia, myalgias, Back Pain, Joint swelling  Neurology= Negative for headache, seizures, numbness, tingling  Psychiatry= Negative for anxiety, depression, suicidal and homocidal ideation Allergy/Immunology= Medication/Food allergy as listed  Skin= Negative for Rash, lesions, ulcers, itching    Objective: Vitals:   05/10/19 1558 05/10/19 2010 05/11/19 0400 05/11/19 0740  BP: (!) 137/55 131/90 (!) 123/58 140/65  Pulse: 66 72 62 90  Resp: 15 18 15 18   Temp: 98.8 F (37.1 C) 97.9 F (36.6 C) 98.2 F (36.8 C) 98.8 F (37.1 C)  TempSrc: Oral Oral Axillary Oral  SpO2: 90% 91% 90% 90%  Weight:      Height:        Intake/Output Summary (Last 24 hours) at 05/11/2019 0944 Last data filed at 05/10/2019 1800 Gross per 24 hour  Intake 360 ml  Output 100 ml  Net 260 ml   Filed Weights   05/08/19 0814  Weight: 49.3 kg    Examination:  Constitutional: Not in acute distress; elderly frail needing 2L [place]. 85% on RA Respiratory: some bibasilar rhonchi.  Cardiovascular: Normal sinus rhythm,  no rubs Abdomen: Nontender nondistended good bowel sounds Musculoskeletal: No edema  noted Skin: No rashes seen Neurologic: CN 2-12 grossly intact.  And nonfocal Psychiatric: Poor judgment and insight. AAOx1 (baseline AAOx2)    External Foley catheter in place  Data Reviewed:   CBC: Recent Labs  Lab 05/07/19 0200 05/08/19 0425 05/09/19 0132 05/10/19 0346 05/11/19 0540  WBC 5.5 4.6 5.8 4.8 10.2  NEUTROABS 4.1 3.2 4.5 3.8 9.1*  HGB 14.1 14.2 14.0 14.4 14.6  HCT 46.5* 46.2* 46.1* 47.2* 47.4*  MCV 88.2 87.7 87.6 87.7 86.5  PLT 213 220 222 234 211   Basic Metabolic Panel: Recent Labs  Lab 05/07/19 0200 05/08/19 0425 05/09/19 0132 05/10/19 0346 05/11/19 0540  NA 142 144 144 145 146*  K 3.6 3.2* 4.3 3.8 3.8  CL 104 106 107 107 108  CO2 23 25 25 27 27   GLUCOSE 66* 74 96 165* 174*  BUN 17 23 18  28* 29*  CREATININE 0.99 0.95 0.84 0.79 0.77  CALCIUM 8.4* 8.6* 8.6* 8.9 9.0  MG  --  2.2 2.2 2.3 2.2   GFR: Estimated Creatinine Clearance: 32 mL/min (by C-G formula based on SCr of 0.77 mg/dL). Liver Function Tests: Recent Labs  Lab 05/07/19 0200 05/08/19 0425 05/09/19 0132 05/10/19 0346 05/11/19 0540  AST 43* 43* 43* 36 35  ALT 20 18 20 19 21   ALKPHOS 52 49 48 51 53  BILITOT 0.5 0.6 0.7 0.5 0.3  PROT 6.9 6.9 7.1 7.0 7.4  ALBUMIN 3.3* 3.1* 3.0* 2.8* 3.0*   No results for input(s): LIPASE, AMYLASE in the last 168 hours. No results for input(s): AMMONIA in the last 168 hours. Coagulation Profile: Recent Labs  Lab 05/06/19 0317  INR 1.0   Cardiac Enzymes: No results for input(s): CKTOTAL, CKMB, CKMBINDEX, TROPONINI in the last 168 hours. BNP (last 3 results) No results for input(s): PROBNP in the last 8760 hours. HbA1C: No results for input(s): HGBA1C in the last 72 hours. CBG: Recent Labs  Lab 05/06/19 0950 05/06/19 2040 05/10/19 1803 05/10/19 2326 05/11/19 0545  GLUCAP 93 106* 204* 182* 166*   Lipid Profile: No results for input(s): CHOL, HDL, LDLCALC, TRIG, CHOLHDL, LDLDIRECT in the last 72 hours. Thyroid Function Tests: No  results for input(s): TSH, T4TOTAL, FREET4, T3FREE, THYROIDAB in the last 72 hours. Anemia Panel: Recent Labs    05/10/19 0346 05/11/19 0540  FERRITIN 678* 637*   Sepsis Labs: Recent Labs  Lab 05/07/19 0200 05/08/19 0425  PROCALCITON 0.12 0.18    Recent Results (from the past 240 hour(s))  SARS CORONAVIRUS 2 (TAT 6-24 HRS) Nasopharyngeal Nasopharyngeal Swab     Status: Abnormal   Collection Time: 05/07/2019 12:37 AM   Specimen: Nasopharyngeal Swab  Result Value Ref Range Status   SARS Coronavirus 2 POSITIVE (A) NEGATIVE Final    Comment: RESULT CALLED TO, READ BACK BY AND VERIFIED WITH: TREY [place] RN.@[id number]  ON 11.10.2020 BY TCALDWELL MT. (NOTE) SARS-CoV-2 target nucleic acids are DETECTED. The SARS-CoV-2 RNA is generally detectable in upper and lower respiratory specimens during the acute phase of infection. Positive results are indicative of active infection with SARS-CoV-2. Clinical  correlation with patient history and other diagnostic information is necessary to determine patient infection status. Positive results do  not rule out bacterial infection or co-infection with other viruses. The expected result is Negative. Fact Sheet for Patients: SugarRoll.be Fact Sheet for Healthcare Providers: https://www.woods-mathews.com/ This test is not yet approved or cleared by the [place] FDA and  has  been authorized for detection and/or diagnosis of SARS-CoV-2 by FDA under an Emergency Use Authorization (EUA). This EUA will remain  in effect (meaning this test ca n be used) for the duration of the COVID-19 declaration under Section 564(b)(1) of the Act, 21 U.S.C. section 360bbb-3(b)(1), unless the authorization is terminated or revoked sooner. Performed at [facility], [address] [place], [place] [address]          Radiology Studies: Dg Chest Port 1 View  Result Date: 05/09/2019 CLINICAL DATA:  Shortness of  breath. EXAM: PORTABLE CHEST 1 VIEW COMPARISON:  Chest x-ray 03-17-19. FINDINGS: Patient rotated to the right. Trachea shifted to the right, this may be secondary to prominent thoracic aorta and patient rotation. No interim change. Heart size stable. Mild bibasilar atelectasis/infiltrates. No pleural effusion or pneumothorax. IMPRESSION: Mild bibasilar atelectasis/infiltrates. Electronically Signed   By: Maisie Fushomas  Register   On: 05/09/2019 11:59        Scheduled Meds: . amLODipine  10 mg Oral Daily  . feeding supplement  1 Container Oral TID BM  . heparin injection (subcutaneous)  5,000 Units Subcutaneous Q8H  . methylPREDNISolone (SOLU-MEDROL) injection  40 mg Intravenous Q12H  . vitamin C  500 mg Oral Daily  . zinc sulfate  220 mg Oral Daily   Continuous Infusions:    LOS: 5 days   Time spent= 35 mins    Janiaya Ryser Joline Maxcyhirag Danila Eddie, MD [facility]  If 7PM-7AM, please contact night-coverage  05/11/2019, 9:44 AM

## 2019-05-11 NOTE — Progress Notes (Addendum)
Pt has not voided tonight. Bladder scan =143ml. Pt has had minimal PO intake. Encouraged pt to drink water. Pt drank approx half cup of water. Would not drink anymore. On call MD notified via [place]. Pt remains in stable condition. Will CTM  Midline would not return blood, lab notified phlebotomy to draw AM labs.

## 2019-05-12 DIAGNOSIS — Z515 Encounter for palliative care: Secondary | ICD-10-CM

## 2019-05-12 DIAGNOSIS — R509 Fever, unspecified: Secondary | ICD-10-CM

## 2019-05-12 LAB — CBC
HCT: 48.9 % — ABNORMAL HIGH (ref 36.0–46.0)
Hemoglobin: 15.2 g/dL — ABNORMAL HIGH (ref 12.0–15.0)
MCH: 27.1 pg (ref 26.0–34.0)
MCHC: 31.1 g/dL (ref 30.0–36.0)
MCV: 87.2 fL (ref 80.0–100.0)
Platelets: 380 10*3/uL (ref 150–400)
RBC: 5.61 MIL/uL — ABNORMAL HIGH (ref 3.87–5.11)
RDW: 14.5 % (ref 11.5–15.5)
WBC: 11.5 10*3/uL — ABNORMAL HIGH (ref 4.0–10.5)
nRBC: 0 % (ref 0.0–0.2)

## 2019-05-12 LAB — GLUCOSE, CAPILLARY
Glucose-Capillary: 172 mg/dL — ABNORMAL HIGH (ref 70–99)
Glucose-Capillary: 140 mg/dL — ABNORMAL HIGH (ref 70–99)
Glucose-Capillary: 238 mg/dL — ABNORMAL HIGH (ref 70–99)
Glucose-Capillary: 216 mg/dL — ABNORMAL HIGH (ref 70–99)

## 2019-05-12 LAB — LACTATE DEHYDROGENASE: LDH: 349 U/L — ABNORMAL HIGH (ref 98–192)

## 2019-05-12 LAB — D-DIMER, QUANTITATIVE: D-Dimer, Quant: 0.49 ug/mL-FEU (ref 0.00–0.50)

## 2019-05-12 LAB — MAGNESIUM: Magnesium: 2.3 mg/dL (ref 1.7–2.4)

## 2019-05-12 MED ORDER — HALOPERIDOL LACTATE 5 MG/ML IJ SOLN
2.0000 mg | Freq: Once | INTRAMUSCULAR | Status: AC
  Administered 2019-05-12: 2 mg via INTRAVENOUS
  Filled 2019-05-12: qty 1

## 2019-05-12 MED ORDER — HALOPERIDOL LACTATE 5 MG/ML IJ SOLN
2.0000 mg | Freq: Four times a day (QID) | INTRAMUSCULAR | Status: DC | PRN
  Administered 2019-05-12 – 2019-05-13 (×2): 2 mg via INTRAVENOUS
  Filled 2019-05-12 (×2): qty 1

## 2019-05-12 NOTE — Progress Notes (Signed)
PROGRESS NOTE    Abigail Formosalice Baskins  ZOX:[id number] DOB: 1920/09/19 DOA: 05/25/2019 PCP: Renaye RakersBland, Veita, MD   Brief Narrative:   85 year old with history of HTN, thrombocytopenia came to the hospital with complains of increasing weakness for the past 3 days.  Per family there is also mild confusion and.  Upon admission she was noted to be febrile with chest x-ray concerns for coarse interstitial pattern.  Ended up being diagnosed with COVID-19.  Initially was not hypoxic, her AKI resolved.    Assessment & Plan:   Principal Problem:   Fever Active Problems:   Essential hypertension   ARF (acute renal failure) (HCC)   Weakness   Thrombocytopenia (HCC)   COVID-19 virus infection   Goals of care, counseling/discussion  COVID-19 pneumonia now with hypoxia 86% on RA -Oxygen levels-2 L nasal cannula saturating 90% -IV solumedrol.  Remdesivir day 2 -trend inflammatory markers -Vitamin C & Zinc. Prone >16hrs/day.  -Procalcitonin- 0.18, repeat BMP tomorrow -Supportive care-antitussive, inhalers, I-S/flutter -CODE STATUS confirmed  Acute kidney injury, prerenal -Unknown exact baseline.  Admission creatinine 1.4.  Improved  Acute metabolic encephalopathy, improved -CT of the head negative.  Suspect possibly from underlying infection/dehydration.  Mentation almost back to baseline.  Generalized weakness -PT/OT= rec Home Health; repeat PT pending.   Essential hypertension -Continue amlodipine -Holding lisinopril  Thrombocytopenia, resolved -Suspect due to acute illness.  Hypokalemia= repletion ordered.   Moderate to severe protein calorie malnutrition with low urine output. -Significantly poor oral intake.  Regular diet.  Encourage p.o. intake.  Palliative care consulted to help discuss goals of care  DVT prophylaxis: Subcutaneous heparin Code Status: Full code Family Communication: Spoke with Bessie Disposition Plan: maintain hosp stay for hypoxia.   Subjective: Laying in the  bed saying no to all the questions.  Poor PO intake.   Review of Systems Otherwise negative except as per HPI, including: Difficult to get full review of system  Objective: Vitals:   05/11/19 1812 05/11/19 1950 05/12/19 0348 05/12/19 0734  BP:  (!) 144/69 134/76 (!) 148/67  Pulse:  88 84 86  Resp:  (!) 21 17 16   Temp:  98 F (36.7 C) 98.1 F (36.7 C) 98.1 F (36.7 C)  TempSrc:  Axillary Oral Oral  SpO2: 90% 91% 93% 93%  Weight:      Height:        Intake/Output Summary (Last 24 hours) at 05/12/2019 1126 Last data filed at 05/11/2019 2200 Gross per 24 hour  Intake 500 ml  Output 500 ml  Net 0 ml   Filed Weights   05/08/19 0814  Weight: 49.3 kg    Examination:  Constitutional: Elderly cachectic frail laying in the bed without any acute distress.  2 L nasal cannula Respiratory: Minimal bibasilar rhonchi Cardiovascular: Normal sinus rhythm, no rubs Abdomen: Nontender nondistended good bowel sounds Musculoskeletal: No edema noted Skin: No rashes seen Neurologic: Exam is nonfocal Psychiatric: Poor judgment and insight.  Alert to her name   External Foley catheter in place  Data Reviewed:   CBC: Recent Labs  Lab 05/07/19 0200 05/08/19 0425 05/09/19 0132 05/10/19 0346 05/11/19 0540 05/12/19 0345  WBC 5.5 4.6 5.8 4.8 10.2 11.5*  NEUTROABS 4.1 3.2 4.5 3.8 9.1*  --   HGB 14.1 14.2 14.0 14.4 14.6 15.2*  HCT 46.5* 46.2* 46.1* 47.2* 47.4* 48.9*  MCV 88.2 87.7 87.6 87.7 86.5 87.2  PLT 213 220 222 234 309 380   Basic Metabolic Panel: Recent Labs  Lab 05/07/19 0200 05/08/19 0425 05/09/19  0132 05/10/19 0346 05/11/19 0540 05/12/19 0345  NA 142 144 144 145 146*  --   K 3.6 3.2* 4.3 3.8 3.8  --   CL 104 106 107 107 108  --   CO2 23 25 25 27 27   --   GLUCOSE 66* 74 96 165* 174*  --   BUN 17 23 18  28* 29*  --   CREATININE 0.99 0.95 0.84 0.79 0.77  --   CALCIUM 8.4* 8.6* 8.6* 8.9 9.0  --   MG  --  2.2 2.2 2.3 2.2 2.3   GFR: Estimated Creatinine  Clearance: 32 mL/min (by C-G formula based on SCr of 0.77 mg/dL). Liver Function Tests: Recent Labs  Lab 05/07/19 0200 05/08/19 0425 05/09/19 0132 05/10/19 0346 05/11/19 0540  AST 43* 43* 43* 36 35  ALT 20 18 20 19 21   ALKPHOS 52 49 48 51 53  BILITOT 0.5 0.6 0.7 0.5 0.3  PROT 6.9 6.9 7.1 7.0 7.4  ALBUMIN 3.3* 3.1* 3.0* 2.8* 3.0*   No results for input(s): LIPASE, AMYLASE in the last 168 hours. No results for input(s): AMMONIA in the last 168 hours. Coagulation Profile: Recent Labs  Lab 05/06/19 0317  INR 1.0   Cardiac Enzymes: No results for input(s): CKTOTAL, CKMB, CKMBINDEX, TROPONINI in the last 168 hours. BNP (last 3 results) No results for input(s): PROBNP in the last 8760 hours. HbA1C: No results for input(s): HGBA1C in the last 72 hours. CBG: Recent Labs  Lab 05/11/19 1212 05/11/19 1646 05/11/19 2145 05/12/19 0630 05/12/19 0737  GLUCAP 158* 187* 203* 172* 140*   Lipid Profile: No results for input(s): CHOL, HDL, LDLCALC, TRIG, CHOLHDL, LDLDIRECT in the last 72 hours. Thyroid Function Tests: No results for input(s): TSH, T4TOTAL, FREET4, T3FREE, THYROIDAB in the last 72 hours. Anemia Panel: Recent Labs    05/10/19 0346 05/11/19 0540  FERRITIN 678* 637*   Sepsis Labs: Recent Labs  Lab 05/07/19 0200 05/08/19 0425  PROCALCITON 0.12 0.18    Recent Results (from the past 240 hour(s))  SARS CORONAVIRUS 2 (TAT 6-24 HRS) Nasopharyngeal Nasopharyngeal Swab     Status: Abnormal   Collection Time: May 26, 2019 12:37 AM   Specimen: Nasopharyngeal Swab  Result Value Ref Range Status   SARS Coronavirus 2 POSITIVE (A) NEGATIVE Final    Comment: RESULT CALLED TO, READ BACK BY AND VERIFIED WITH: TREY 13/11/20 RN.@0510  ON 11.10.2020 BY TCALDWELL MT. (NOTE) SARS-CoV-2 target nucleic acids are DETECTED. The SARS-CoV-2 RNA is generally detectable in upper and lower respiratory specimens during the acute phase of infection. Positive results are indicative of active  infection with SARS-CoV-2. Clinical  correlation with patient history and other diagnostic information is necessary to determine patient infection status. Positive results do  not rule out bacterial infection or co-infection with other viruses. The expected result is Negative. Fact Sheet for Patients: 13/09/20 Fact Sheet for Healthcare Providers: [place] This test is not yet approved or cleared by the 13.10.2020 FDA and  has been authorized for detection and/or diagnosis of SARS-CoV-2 by FDA under an Emergency Use Authorization (EUA). This EUA will remain  in effect (meaning this test ca n be used) for the duration of the COVID-19 declaration under Section 564(b)(1) of the Act, 21 U.S.C. section 360bbb-3(b)(1), unless the authorization is terminated or revoked sooner. Performed at [facility], [address] [place], [facility] [address]          Radiology Studies: Dg Chest Port 1 View  Result Date: 05/11/2019 CLINICAL  DATA:  Dyspnea EXAM: PORTABLE CHEST 1 VIEW COMPARISON:  Multiple exams, including levin/13/20 FINDINGS: Mild interstitial accentuation both lung bases without well-defined airspace opacity. The patient is rotated to the right on today's radiograph, reducing diagnostic sensitivity and specificity. No significant blunting of the costophrenic angles. Chronic rightward tracheal deviation. IMPRESSION: 1. Mild nonspecific interstitial accentuation in the lung bases, without well-defined airspace opacity. 2. Chronic rightward tracheal deviation. Electronically Signed   By: Van Clines M.D.   On: 05/11/2019 16:27        Scheduled Meds: . amLODipine  10 mg Oral Daily  . feeding supplement  1 Container Oral TID BM  . heparin injection (subcutaneous)  5,000 Units Subcutaneous Q8H  . methylPREDNISolone (SOLU-MEDROL) injection  40 mg Intravenous Q12H  . vitamin C  500 mg Oral Daily  . zinc  sulfate  220 mg Oral Daily   Continuous Infusions: . remdesivir 100 mg in NS 250 mL 100 mg (05/12/19 1051)     LOS: 6 days   Time spent= 35 mins    Ankit Arsenio Loader, MD [facility]  If 7PM-7AM, please contact night-coverage  05/12/2019, 11:26 AM

## 2019-05-12 NOTE — Progress Notes (Addendum)
Pt was on 5L Jeff at shift change. Pt continues to manage to pull off O2 even with soft wrist restraints on. Would desat to 70% and take several minutes to recover after oxygen was reapplied. Most recently did not recover above 86%. Now on 9L HFNC satting 90%. Confusion and agitation ongoing. Physician notified. Other vitals stable. MEWS yellow and vitals increased.  0100 Physician paged for increasing HR sustaining 120s-140s with worsening mental status and increaseing O2 needs (9L HFN to 11L HFNC). Awaiting response.  Patient also repeating that she needs to pee, but bladder scan showed only 27ml.  One time dose 10 mg Cardizem IV ordered and administered. HR improved to 90s with occasional spikes into 140s, not sustained.  7793: Pt's HR sustaining 120s-140s with spikes to 160, sinus tach with PACs per tele. MD notified.  New order placed for dilt and EKG. Attempted to obtain EKG but too much artifact. Cardizem push administered and HR currently 90s. Oxygen down to 5L HFNC  **MEWS red at 0600 due to documentation error of respiratory rate**  Now Green after medication administration and repositioning

## 2019-05-12 NOTE — Progress Notes (Signed)
Physical Therapy Treatment Patient Details Name: Abigail Patel MRN: [id number] DOB: 10-30-1920 Today's Date: 05/12/2019    History of Present Illness 85 y/o female pt w/ hx of HTN. presented to hospital with c/o increasing weakness for 3 days. pt dx with COVID, admitted with fever, HTN, ARF, weakness, thrombocytopenia, and COVID.    PT Comments    Patient remains confused but able to follow simple instructions. Once standing she reported dizziness and then quickly sat down on EOB and then laid herself down on bed with noted HR 172 bpm. Quickly dropped to 140s-160s, eventually down to 80-120s bpm (after ~4 minutes). Pt reported feeling very tired and wanting to be allowed to rest (at beginning of session was very eager to get OOB ). Seth Bake, RN made aware and she stated she would notify MD. Due to limited mobility this date and have been unable to confirm home level of support, may need to consider SNF.    Follow Up Recommendations  Home health PT;SNF;Supervision/Assistance - 24 hour(unclear agree of family support and if safe to return home)     Equipment Recommendations  Rolling walker with 5" wheels    Recommendations for Other Services       Precautions / Restrictions Precautions Precautions: Fall Restrictions Weight Bearing Restrictions: No    Mobility  Bed Mobility Overal bed mobility: Needs Assistance Bed Mobility: Supine to Sit;Sit to Supine     Supine to sit: Mod assist;HOB elevated Sit to supine: Min assist   General bed mobility comments: required assist to initiate bed mobility and for trunk elevation; pt self initiating return to supine with assist for legs up onto bed; HR elevated and refusing to be boosted up to the [facility] "just leave me be!"   Transfers Overall transfer level: Needs assistance Equipment used: Rolling walker (2 wheeled) Transfers: Sit to/from Stand Sit to Stand: Min assist         General transfer comment: assist for forward weight shift    Ambulation/Gait             General Gait Details: Stood ~10 seconds, reported dizziness and quickly returned to sitting without warning; noted HR 172 and pt impulsively laid back in bed with assist for legs due to walker, lines, etc   Stairs             Wheelchair Mobility    Modified Rankin (Stroke Patients Only)       Balance Overall balance assessment: Needs assistance Sitting-balance support: Feet supported;Feet unsupported Sitting balance-Leahy Scale: Fair     Standing balance support: During functional activity;Single extremity supported Standing balance-Leahy Scale: Poor Standing balance comment: needed UE support on RW                            Cognition Arousal/Alertness: Awake/alert Behavior During Therapy: WFL for tasks assessed/performed;Flat affect Overall Cognitive Status: No family/caregiver present to determine baseline cognitive functioning                                 General Comments: pt confused this session and per RN has been getting up on her own mulitple times, pulling IV, lines etc off      Exercises      General Comments        Pertinent Vitals/Pain Pain Assessment: Faces Faces Pain Scale: No hurt    Home Living  Prior Function            PT Goals (current goals can now be found in the care plan section) Acute Rehab PT Goals Patient Stated Goal: "I want to lay down" Time For Goal Achievement: 05/21/19 Potential to Achieve Goals: Fair Progress towards PT goals: Not progressing toward goals - comment(elevated HR)    Frequency    Min 3X/week      PT Plan Discharge plan needs to be updated    Co-evaluation              AM-PAC PT "6 Clicks" Mobility   Outcome Measure  Help needed turning from your back to your side while in a flat bed without using bedrails?: A Little Help needed moving from lying on your back to sitting on the side of a flat  bed without using bedrails?: A Lot Help needed moving to and from a bed to a chair (including a wheelchair)?: A Lot Help needed standing up from a chair using your arms (e.g., wheelchair or bedside chair)?: A Lot Help needed to walk in hospital room?: Total Help needed climbing 3-5 steps with a railing? : Total 6 Click Score: 11    End of Session Equipment Utilized During Treatment: Gait belt;Oxygen Activity Tolerance: Treatment limited secondary to medical complications (Comment);Treatment limited secondary to agitation Patient left: in bed;with call bell/phone within reach;with bed alarm set Nurse Communication: Mobility status(HR elevated with activity) PT Visit Diagnosis: Unsteadiness on feet (R26.81);Other abnormalities of gait and mobility (R26.89);Muscle weakness (generalized) (M62.81)     Time: 0086-7619 PT Time Calculation (min) (ACUTE ONLY): 22 min  Charges:  $Therapeutic Activity: 8-22 mins                      Veda Canning, PT     Zena Amos 05/12/2019, 12:39 PM

## 2019-05-13 ENCOUNTER — Inpatient Hospital Stay (HOSPITAL_COMMUNITY): Payer: Medicare Other

## 2019-05-13 DIAGNOSIS — R627 Adult failure to thrive: Secondary | ICD-10-CM

## 2019-05-13 DIAGNOSIS — E43 Unspecified severe protein-calorie malnutrition: Secondary | ICD-10-CM

## 2019-05-13 DIAGNOSIS — D72819 Decreased white blood cell count, unspecified: Secondary | ICD-10-CM

## 2019-05-13 LAB — C-REACTIVE PROTEIN: CRP: 8.2 mg/dL — ABNORMAL HIGH (ref <1.0)

## 2019-05-13 LAB — CBC
HCT: 46.8 % — ABNORMAL HIGH (ref 36.0–46.0)
Hemoglobin: 14.3 g/dL (ref 12.0–15.0)
MCH: 26.9 pg (ref 26.0–34.0)
MCHC: 30.6 g/dL (ref 30.0–36.0)
MCV: 88 fL (ref 80.0–100.0)
Platelets: 390 10*3/uL (ref 150–400)
RBC: 5.32 MIL/uL — ABNORMAL HIGH (ref 3.87–5.11)
RDW: 14.8 % (ref 11.5–15.5)
WBC: 11.4 10*3/uL — ABNORMAL HIGH (ref 4.0–10.5)
nRBC: 0 % (ref 0.0–0.2)

## 2019-05-13 LAB — COMPREHENSIVE METABOLIC PANEL
ALT: 35 U/L (ref 0–44)
AST: 55 U/L — ABNORMAL HIGH (ref 15–41)
Albumin: 2.9 g/dL — ABNORMAL LOW (ref 3.5–5.0)
Alkaline Phosphatase: 55 U/L (ref 38–126)
Anion gap: 16 — ABNORMAL HIGH (ref 5–15)
BUN: 32 mg/dL — ABNORMAL HIGH (ref 8–23)
CO2: 23 mmol/L (ref 22–32)
Calcium: 8.6 mg/dL — ABNORMAL LOW (ref 8.9–10.3)
Chloride: 110 mmol/L (ref 98–111)
Creatinine, Ser: 0.99 mg/dL (ref 0.44–1.00)
GFR calc Af Amer: 56 mL/min — ABNORMAL LOW (ref >60)
GFR calc non Af Amer: 48 mL/min — ABNORMAL LOW (ref >60)
Glucose, Bld: 186 mg/dL — ABNORMAL HIGH (ref 70–99)
Potassium: 3.4 mmol/L — ABNORMAL LOW (ref 3.5–5.1)
Sodium: 149 mmol/L — ABNORMAL HIGH (ref 135–145)
Total Bilirubin: 0.5 mg/dL (ref 0.3–1.2)
Total Protein: 6.8 g/dL (ref 6.5–8.1)

## 2019-05-13 LAB — GLUCOSE, CAPILLARY
Glucose-Capillary: 213 mg/dL — ABNORMAL HIGH (ref 70–99)
Glucose-Capillary: 196 mg/dL — ABNORMAL HIGH (ref 70–99)
Glucose-Capillary: 189 mg/dL — ABNORMAL HIGH (ref 70–99)
Glucose-Capillary: 235 mg/dL — ABNORMAL HIGH (ref 70–99)

## 2019-05-13 LAB — FERRITIN: Ferritin: 507 ng/mL — ABNORMAL HIGH (ref 11–307)

## 2019-05-13 LAB — BRAIN NATRIURETIC PEPTIDE: B Natriuretic Peptide: 216.8 pg/mL — ABNORMAL HIGH (ref 0.0–100.0)

## 2019-05-13 LAB — LACTATE DEHYDROGENASE: LDH: 409 U/L — ABNORMAL HIGH (ref 98–192)

## 2019-05-13 LAB — D-DIMER, QUANTITATIVE: D-Dimer, Quant: 0.61 ug/mL-FEU — ABNORMAL HIGH (ref 0.00–0.50)

## 2019-05-13 LAB — MAGNESIUM: Magnesium: 2.3 mg/dL (ref 1.7–2.4)

## 2019-05-13 MED ORDER — MORPHINE SULFATE (CONCENTRATE) 10 MG/0.5ML PO SOLN: 10.0000 mg | ORAL | Status: DC | PRN

## 2019-05-13 MED ORDER — VANCOMYCIN HCL IN DEXTROSE 1-5 GM/200ML-% IV SOLN
1000.0000 mg | Freq: Once | INTRAVENOUS | Status: AC
  Administered 2019-05-13: 1000 mg via INTRAVENOUS
  Filled 2019-05-13: qty 200

## 2019-05-13 MED ORDER — SODIUM CHLORIDE 0.9 % IV SOLN
100.0000 mg | INTRAVENOUS | Status: AC
  Administered 2019-05-14 – 2019-05-15 (×2): 100 mg via INTRAVENOUS
  Filled 2019-05-13 (×2): qty 100

## 2019-05-13 MED ORDER — DILTIAZEM LOAD VIA INFUSION
10.0000 mg | Freq: Once | INTRAVENOUS | Status: DC
  Filled 2019-05-13: qty 10

## 2019-05-13 MED ORDER — VANCOMYCIN HCL IN DEXTROSE 1-5 GM/200ML-% IV SOLN: 1000.0000 mg | INTRAVENOUS | Status: DC

## 2019-05-13 MED ORDER — DILTIAZEM HCL 25 MG/5ML IV SOLN
10.0000 mg | Freq: Once | INTRAVENOUS | Status: AC
  Administered 2019-05-13: 10 mg via INTRAVENOUS
  Filled 2019-05-13: qty 5

## 2019-05-13 MED ORDER — LORAZEPAM 2 MG/ML IJ SOLN
1.0000 mg | INTRAMUSCULAR | Status: DC | PRN
  Administered 2019-05-13 – 2019-05-15 (×5): 1 mg via INTRAVENOUS
  Filled 2019-05-13 (×5): qty 1

## 2019-05-13 MED ORDER — METHYLPREDNISOLONE SODIUM SUCC 40 MG IJ SOLR
40.0000 mg | Freq: Every day | INTRAMUSCULAR | Status: DC
  Administered 2019-05-14: 40 mg via INTRAVENOUS
  Filled 2019-05-13: qty 1

## 2019-05-13 MED ORDER — DILTIAZEM LOAD VIA INFUSION: 10.0000 mg | Freq: Once | INTRAVENOUS | Status: DC

## 2019-05-13 MED ORDER — METHYLPREDNISOLONE SODIUM SUCC 40 MG IJ SOLR: 40.0000 mg | Freq: Every day | INTRAMUSCULAR | Status: DC

## 2019-05-13 MED ORDER — SODIUM CHLORIDE 0.9% FLUSH: 10.0000 mL | INTRAVENOUS | Status: DC | PRN

## 2019-05-13 MED ORDER — IOHEXOL 350 MG/ML SOLN
100.0000 mL | Freq: Once | INTRAVENOUS | Status: AC | PRN
  Administered 2019-05-13: 100 mL via INTRAVENOUS

## 2019-05-13 MED ORDER — AMLODIPINE BESYLATE 10 MG PO TABS
10.0000 mg | ORAL_TABLET | Freq: Every day | ORAL | Status: DC
  Filled 2019-05-13 (×2): qty 1

## 2019-05-13 MED ORDER — SODIUM CHLORIDE 0.9 % IV SOLN
2.0000 g | Freq: Every day | INTRAVENOUS | Status: DC
  Administered 2019-05-14: 2 g via INTRAVENOUS
  Filled 2019-05-13: qty 2

## 2019-05-13 MED ORDER — MORPHINE SULFATE (PF) 2 MG/ML IV SOLN
1.0000 mg | INTRAVENOUS | Status: DC | PRN
  Administered 2019-05-14 – 2019-05-15 (×7): 1 mg via INTRAVENOUS
  Filled 2019-05-13 (×7): qty 1

## 2019-05-13 MED ORDER — HEPARIN SODIUM (PORCINE) 5000 UNIT/ML IJ SOLN
5000.0000 [IU] | Freq: Three times a day (TID) | INTRAMUSCULAR | Status: DC
  Administered 2019-05-14 – 2019-05-15 (×5): 5000 [IU] via SUBCUTANEOUS
  Filled 2019-05-13 (×6): qty 1

## 2019-05-13 MED ORDER — HALOPERIDOL LACTATE 5 MG/ML IJ SOLN: 1.0000 mg | Freq: Four times a day (QID) | INTRAMUSCULAR | Status: DC | PRN

## 2019-05-13 MED ORDER — SODIUM CHLORIDE 0.9 % IV SOLN
2.0000 g | Freq: Every day | INTRAVENOUS | Status: DC
  Administered 2019-05-13: 2 g via INTRAVENOUS
  Filled 2019-05-13: qty 2

## 2019-05-13 MED ORDER — LORAZEPAM 2 MG/ML IJ SOLN: 2.0000 mg | Freq: Once | INTRAMUSCULAR | Status: DC

## 2019-05-13 MED ORDER — ZINC SULFATE 220 (50 ZN) MG PO CAPS
220.0000 mg | ORAL_CAPSULE | Freq: Every day | ORAL | Status: DC
  Filled 2019-05-13 (×2): qty 1

## 2019-05-13 NOTE — Progress Notes (Addendum)
PROGRESS NOTE    Abigail Patel  TTS:[id number] DOB: Dec 18, 1920 DOA: 2019/06/02 PCP: Renaye Rakers, MD   Brief Narrative:   85 year old with history of HTN, thrombocytopenia came to the hospital with complains of increasing weakness for the past 3 days.  Per family there is also mild confusion and.  Upon admission she was noted to be febrile with chest x-ray concerns for coarse interstitial pattern.  Ended up being diagnosed with COVID-19.  Initially was not hypoxic, her AKI resolved.  Then slowly started getting hypoxic therefore started on remdesivir and Solu-Medrol. Due to persistent hypoxia CTA Chest ordered.   Assessment & Plan:   Principal Problem:   Fever Active Problems:   Essential hypertension   ARF (acute renal failure) (HCC)   Weakness   Thrombocytopenia (HCC)   COVID-19 virus infection   Goals of care, counseling/discussion  COVID-19 pneumonia now with hypoxia 86% on RA Acute persistent hypoxia -Now patient is requiring anywhere from 4-7 L of oxygen.  Inflammatory markers are stable.  Continue IV Solu-Medrol.  Remdesivir day 3.  Continue vitamin C and zinc. -We will obtain CTA chest to rule out PE and get further look at her lung tissue -Antitussive, inhalers, flutter valve/I-S -Check procalcitonin -Very tricky to give her Lasix as it is causing intravascular volume depletion.  Leukocytosis -Possible dehydration? Steroids?  But given worsening of oxygen levels will empirically start patient on vancomycin and cefepime.  MRSA screening swab.  CTA chest to further help [place] as well. -Initial UA-negative.  Acute kidney injury, prerenal -Unknown exact baseline.  Admission creatinine 1.4.  Improved  Acute metabolic encephalopathy, improved -CT of the head is negative but her mentation is fluctuating-suspect some delirium.  Supportive care.  Generalized weakness -PT/OT= rec Home Health; repeat PT pending.   Essential hypertension -Continue amlodipine -Holding  lisinopril  Thrombocytopenia, resolved -Suspect due to acute illness.  Hypokalemia= repletion ordered.   Moderate to severe protein calorie malnutrition with low urine output. -Poor oral intake.  Encourage oral diet.  Palliative care following to help establish goals of care.  DVT prophylaxis: Subcutaneous heparin Code Status: Full code Family Communication: Spoke with her daughter Geryl Rankins Disposition Plan: Maintain hospital stay due to worsening of hypoxia.  Currently on IV Solu-Medrol and remdesivir.  Subjective: Laying in the bed trying to get out of her mittens.  Alert to her name but does not carry on any meaningful conversation.  Denies any complaints.  Review of Systems Otherwise negative except as per HPI, including: General = no fevers, chills, dizziness, malaise, fatigue HEENT/EYES = negative for pain, redness, loss of vision, double vision, blurred vision, loss of hearing, sore throat, hoarseness, dysphagia Cardiovascular= negative for chest pain, palpitation, murmurs, lower extremity swelling Respiratory/lungs= negative for  cough, hemoptysis, wheezing, mucus production Gastrointestinal= negative for nausea, vomiting,, abdominal pain, melena, hematemesis Genitourinary= negative for Dysuria, Hematuria, Change in Urinary Frequency MSK = Negative for arthralgia, myalgias, Back Pain, Joint swelling  Neurology= Negative for headache, seizures, numbness, tingling  Psychiatry= Negative for anxiety, depression, suicidal and homocidal ideation Allergy/Immunology= Medication/Food allergy as listed  Skin= Negative for Rash, lesions, ulcers, itching   Objective: Vitals:   05/13/19 0200 05/13/19 0400 05/13/19 0600 05/13/19 0700  BP: 96/80 (!) 108/59 116/69 (!) 129/55  Pulse: (!) 109 71 66 84  Resp: 20 18 19 20   Temp:  99.4 F (37.4 C)    TempSrc:  Oral    SpO2: 90% 96% 92% 94%  Weight:      Height:  Intake/Output Summary (Last 24 hours) at 05/13/2019 0741 Last  data filed at 05/13/2019 0500 Gross per 24 hour  Intake 750 ml  Output -  Net 750 ml   Filed Weights   05/08/19 0814  Weight: 49.3 kg    Examination:  Constitutional: Not in acute distress, try to get out of mittens.  4 L nasal cannula Respiratory: Minimal bibasilar rhonchi Cardiovascular: Normal sinus rhythm, no rubs Abdomen: Nontender nondistended good bowel sounds Musculoskeletal: No edema noted Skin: Bilateral lower extremities slight discoloration Neurologic: Overall nonfocal exam Psychiatric: Poor judgment and insight.  Alert only to her name     External Foley catheter in place  Data Reviewed:   CBC: Recent Labs  Lab 05/07/19 0200 05/08/19 0425 05/09/19 0132 05/10/19 0346 05/11/19 0540 05/12/19 0345  WBC 5.5 4.6 5.8 4.8 10.2 11.5*  NEUTROABS 4.1 3.2 4.5 3.8 9.1*  --   HGB 14.1 14.2 14.0 14.4 14.6 15.2*  HCT 46.5* 46.2* 46.1* 47.2* 47.4* 48.9*  MCV 88.2 87.7 87.6 87.7 86.5 87.2  PLT 213 220 222 234 309 696   Basic Metabolic Panel: Recent Labs  Lab 05/07/19 0200 05/08/19 0425 05/09/19 0132 05/10/19 0346 05/11/19 0540 05/12/19 0345  NA 142 144 144 145 146*  --   K 3.6 3.2* 4.3 3.8 3.8  --   CL 104 106 107 107 108  --   CO2 23 25 25 27 27   --   GLUCOSE 66* 74 96 165* 174*  --   BUN 17 23 18  28* 29*  --   CREATININE 0.99 0.95 0.84 0.79 0.77  --   CALCIUM 8.4* 8.6* 8.6* 8.9 9.0  --   MG  --  2.2 2.2 2.3 2.2 2.3   GFR: Estimated Creatinine Clearance: 32 mL/min (by C-G formula based on SCr of 0.77 mg/dL). Liver Function Tests: Recent Labs  Lab 05/07/19 0200 05/08/19 0425 05/09/19 0132 05/10/19 0346 05/11/19 0540  AST 43* 43* 43* 36 35  ALT 20 18 20 19 21   ALKPHOS 52 49 48 51 53  BILITOT 0.5 0.6 0.7 0.5 0.3  PROT 6.9 6.9 7.1 7.0 7.4  ALBUMIN 3.3* 3.1* 3.0* 2.8* 3.0*   No results for input(s): LIPASE, AMYLASE in the last 168 hours. No results for input(s): AMMONIA in the last 168 hours. Coagulation Profile: No results for input(s): INR,  PROTIME in the last 168 hours. Cardiac Enzymes: No results for input(s): CKTOTAL, CKMB, CKMBINDEX, TROPONINI in the last 168 hours. BNP (last 3 results) No results for input(s): PROBNP in the last 8760 hours. HbA1C: No results for input(s): HGBA1C in the last 72 hours. CBG: Recent Labs  Lab 05/12/19 0630 05/12/19 0737 05/12/19 1111 05/12/19 1606 05/13/19 0628  GLUCAP 172* 140* 238* 216* 196*   Lipid Profile: No results for input(s): CHOL, HDL, LDLCALC, TRIG, CHOLHDL, LDLDIRECT in the last 72 hours. Thyroid Function Tests: No results for input(s): TSH, T4TOTAL, FREET4, T3FREE, THYROIDAB in the last 72 hours. Anemia Panel: Recent Labs    05/11/19 0540  FERRITIN 637*   Sepsis Labs: Recent Labs  Lab 05/07/19 0200 05/08/19 0425  PROCALCITON 0.12 0.18    Recent Results (from the past 240 hour(s))  SARS CORONAVIRUS 2 (TAT 6-24 HRS) Nasopharyngeal Nasopharyngeal Swab     Status: Abnormal   Collection Time: 05/13/2019 12:37 AM   Specimen: Nasopharyngeal Swab  Result Value Ref Range Status   SARS Coronavirus 2 POSITIVE (A) NEGATIVE Final    Comment: RESULT CALLED TO, READ BACK BY AND VERIFIED  WITH: TREY [place] RN.@0510  ON 11.10.2020 BY TCALDWELL MT. (NOTE) SARS-CoV-2 target nucleic acids are DETECTED. The SARS-CoV-2 RNA is generally detectable in upper and lower respiratory specimens during the acute phase of infection. Positive results are indicative of active infection with SARS-CoV-2. Clinical  correlation with patient history and other diagnostic information is necessary to determine patient infection status. Positive results do  not rule out bacterial infection or co-infection with other viruses. The expected result is Negative. Fact Sheet for Patients: HairSlick.nohttps://www.fda.gov/media/138098/download Fact Sheet for Healthcare Providers: quierodirigir.comhttps://www.fda.gov/media/138095/download This test is not yet approved or cleared by the [place] FDA and  has been authorized for  detection and/or diagnosis of SARS-CoV-2 by FDA under an Emergency Use Authorization (EUA). This EUA will remain  in effect (meaning this test ca n be used) for the duration of the COVID-19 declaration under Section 564(b)(1) of the Act, 21 U.S.C. section 360bbb-3(b)(1), unless the authorization is terminated or revoked sooner. Performed at [facility], [address] [place], [place] [address]          Radiology Studies: Dg Chest Port 1 View  Result Date: 05/11/2019 CLINICAL DATA:  Dyspnea EXAM: PORTABLE CHEST 1 VIEW COMPARISON:  Multiple exams, including levin/13/20 FINDINGS: Mild interstitial accentuation both lung bases without well-defined airspace opacity. The patient is rotated to the right on today's radiograph, reducing diagnostic sensitivity and specificity. No significant blunting of the costophrenic angles. Chronic rightward tracheal deviation. IMPRESSION: 1. Mild nonspecific interstitial accentuation in the lung bases, without well-defined airspace opacity. 2. Chronic rightward tracheal deviation. Electronically Signed   By: Gaylyn RongWalter  Liebkemann M.D.   On: 05/11/2019 16:27        Scheduled Meds: . amLODipine  10 mg Oral Daily  . feeding supplement  1 Container Oral TID BM  . heparin injection (subcutaneous)  5,000 Units Subcutaneous Q8H  . LORazepam  2 mg Intravenous Once  . methylPREDNISolone (SOLU-MEDROL) injection  40 mg Intravenous Q12H  . vitamin C  500 mg Oral Daily  . zinc sulfate  220 mg Oral Daily   Continuous Infusions: . remdesivir 100 mg in NS 250 mL 100 mg (05/12/19 1051)     LOS: 7 days   Time spent= 35 mins    Ankit Joline Maxcyhirag Amin, MD [facility]  If 7PM-7AM, please contact night-coverage  05/13/2019, 7:41 AM

## 2019-05-13 NOTE — Progress Notes (Addendum)
Palliative Care Care Plan Revision/Update  Earlier his evening I had an in depth conversation regarding the patient's prognosis, decline and goals of care with patient's son (the one that lives with the patient) and another daughter (who is here locally). After sharing information on her condition and presenting options for her care, they very clearly chose a more comfort oriented path that included possible discharge home with hospice care and expressed a desire to provide compassionate EOL care in a location where they could be with her and care for her.    About an hour after this call I received notification from the patient's RN that the patient's daughter Marnette Burgess had called with urgent concerns about how her mother was going to be treated saying there was a misunderstanding and they wanted "everything done".  I promptly returned the phone call to the family members from out of town (they live in [place])- three people were added to the call that included Bessie, daughter, Wess Botts and [place], [place]. I spent over 60 minutes on the phone with them answering their questions, respectfully challenging requests for inapproriate interventions and trying to help them understand the overall big picture.  Summary of Goals of Care Conversation 2.0  1. They led call with we want her to "fight and fight hard" and "we want her to have every treatment possible and more". I reassured them that for the 8 days she has been in our specialty [facility] that she had received every evidence based intervention and protocol that we have for Covid. They requested a copy of our protocol.  2. The patient's granddaughter is a Community education officer and apparently worked on the [employer] trials for ebola -she wanted that restarted and continued indefinitely -I told her this was a protocol medication and that I doubt there was added benefit to continuing it beyond the symptomatic phase and that right now her  grandmother was probably in the inflammatory phase of Covid-she also has some academic experience in nutrition and vitamins, she had questions about vitamin C, Zinc and other vitamins. She had specific dosage and administration information and requests.She wanted to know what other antiinflammatory or immunotherapies were going to be offered and wanted a pulmonologist to call her.   3. I shared the results of the CT scan and the severe COVID pneumonia and explained why viral pneumonia and the ground glass appearance was very concerning.I also shared mortality data for people >97 years of age.  4. After going through data, protocols, meds and results - I asked permission to share with them her human condition- I explained that  she was very confused, not able to communicate, agitated, requiring her arms to be tied down just to keep IV access, that she was often refusing food, meds and was struggling and suffering.I expressed my sincere worry for this and how terrifying it must be to be tied down, having air hunger and connected to strange machines and have blood drawn and  people in gear all around you when you have dementia and can understand what is happening. Given this they agree to DNR and to having comfort meds ordered but only if all treatments are continued. They acknowledged that she is 76 and that she may not survive this-their biggest fear is that she will not be offered some treatment or intervention that will help her.  5. They provided information about communication preferences- there is additional misunderstanding between her children here locally and her children and family out of town. These  family members told me that her son Sherilyn Cooter that she lives with has COVID and is also on Hemodialysis and he is not doing well and cant take care of her.I explained to them without a formal HCPOA that [place] law mandates that [facility] surrogate decision makers would include all of her children and consensus must be  our goal.  We ended the conversation with consensus that all reasonable medical interventions should be offered including off label covid treatments that may help and are in published protocols and that we will also simultaneously provide comfort measures as needed especially in the event of decline or distress.She should not be intubated but supported with oxygen. In the event of cardiac arrest no compressions should be done. They want all the meds she was getting prior to admission to be continued- "nothing taken away from her". Her granddaughter had specific vitamin requests and dosing which I ordered and told her we would not force them but if patient could swallow them safely they would be offered and same applied to all other oral medications",  I reversed the comfort measures orders based on this conversation, but left PRNs in case of terminal decline. I prepared them for the possible (probable) hospital death and anticipated decline.  I am available should they decide for a less medicalized EOL plan for their mother or their goals change.I am available to continue conversations with them recognizing theses are time consuming and overwhelming for the medical team with our current patient volumes.  Anderson Malta, DO Palliative Medicine

## 2019-05-13 NOTE — Progress Notes (Signed)
Pharmacy Antibiotic Note  Abigail Patel is a 85 y.o. female admitted on 05/01/2019 with pneumonia.  Pharmacy has been consulted for cefepime and vancomycin dosing.  Plan: Cefepime 2g IV q24h. Vancomycin 1g IV x1, then 1g IV q48h. Measure Vanc peak and trough at steady state. Goal AUC = 400 - 550.  Expected AUC 486 using SCr 1 Follow up renal function, culture results, and clinical course.    Height: 5\' 5"  (165.1 cm) Weight: 108 lb 11 oz (49.3 kg) IBW/kg (Calculated) : 57  Temp (24hrs), Avg:98.4 F (36.9 C), Min:97.8 F (36.6 C), Max:99.4 F (37.4 C)  Recent Labs  Lab 05/08/19 0425 05/09/19 0132 05/10/19 0346 05/11/19 0540 05/12/19 0345 05/13/19 0315  WBC 4.6 5.8 4.8 10.2 11.5* 11.4*  CREATININE 0.95 0.84 0.79 0.77  --  0.99    Estimated Creatinine Clearance: 25.9 mL/min (by C-G formula based on SCr of 0.99 mg/dL).    No Known Allergies  Antimicrobials this admission: 11/15 Remdesivir >> 11/19 11/17 Cefepime >> 11/17 Vancomycin >>   Dose adjustments this admission:   Microbiology results: 11/9 SARS CoV2:  positive  Thank you for allowing pharmacy to be a part of this patient's care.  Gretta Arab PharmD, BCPS Clinical pharmacist phone 7am- 5pm: [phone] 05/13/2019 10:02 AM

## 2019-05-13 NOTE — Progress Notes (Addendum)
Palliative Care Progress Note  I spoke at length with patient's son Mallie Mussel who is her main caregiver and he was speaking and conveying information to his sisters while we were talking. He is is a CNA in long term care and understands the suffering involved with being out of her familiar environment.  Ms. Muffley at baseline had advanced dementia and required nearly total assist with all ADLs prior to admission. The family had limited understanding of the severity of her illness as it related to her COVID infection and the impact of the infection on her QOL and current level of suffering. They asked me to be direct and honest in making a recommendation about how to best care for her so based on her COVID19 infection affecting her lungs, pre-existing advanced dementia, advanced age, her current O2 requirements, persistent and severe delirium, poor PO intake and overall suffering I recommended that we switch to comfort care and try to ger her home in her familiar environment and setting with hospice care. Her son was very agreeable for this and has previous positive experience with hospice care. He is hopeful when she returns to her familiar setting she will be more calm and be able to have a peaceful EOL surrounded by family.  Recommendations:  1. Discharge home with hospice care 2. Discontinue aggressive medical interventions and monitoring, avoid IV sticks and other uncomfortable procedures. 3. Use comfort medications as ordered and allow for uninterrupted sleep when possible. Will transition IV comfort meds to Sublingual on Discharge. 4. DNR order placed to reflect comfort care goals.  Lane Hacker, DO Palliative Medicine [phone] (mobile)  Time: 35 min Greater than 50%  of this time was spent counseling and coordinating care related to the above assessment and plan.

## 2019-05-13 NOTE — Progress Notes (Signed)
CTA done= neg for PE but shows diffuse GGO consistent with covid 19 pna.

## 2019-05-13 NOTE — Progress Notes (Signed)
Staci Righter RN received an order to discontinue telemetry on patient from Dr. Reesa Chew at 14:00.

## 2019-05-13 NOTE — Progress Notes (Signed)
Patient pulling off all monitoring and attempting to DC IV during 12 hour shift. Attempted frequent reorientation and interventions but patient continued behaviors. Haldol 2mg  given and patient stopped behaviors for approximately an hour. Received an order from doctor for 2 point restraints and Haldol 2mg  PRN q6h.  Placed patient in restraints at 18:55. Will continue to monitor.

## 2019-05-13 NOTE — Progress Notes (Addendum)
2040 Received call from patient's daughter, Abigail Patel, and Abigail Patel's husband. Abigail Patel and husband were upset after receiving a call from their brother, Abigail Patel, who had spoken with Palliative earlier in the evening. Abigail Patel thought that the hospital was threatening to throw her mother out for noncompliance. This RN explained that this was not true, and proceeded to clarify the conversation that had happened with palliative earlier, based off of the note. After explaining that the patient had been transitioned to comfort care, and what comfort care means, Abigail Patel and her husband became very upset. They insisted that Abigail Patel's brother Abigail Patel was "not all there" and should not be making decisions for the patient. They stated that the their impression from the hospitalist earlier in the day was that the patient was going to recover and was doing well. This RN described the patient's mental status and oxygen needs, and how these had changed from previous days, as well as her nutrition deficit and refusal of food. Abigail Patel and husband remained very upset and insisted that the orders be changed for the patient immediately. This RN explained that she could not change orders, and recommended that they speak with Dr. Hilma Favors from palliative regarding goals of care and disagreement between family members.  Dr. Hilma Favors was called and asked to speak with Abigail Patel and her husband (see palliative note). Following this conversation, patient remains DNR but not on comfort care. Will continue treatment per orders.  Note that Abigail Patel (CNA at a LTC) is the patient's caretaker at home and all family members listed in demographics actively want a say in their mother's plan of care.

## 2019-05-13 NOTE — Consult Note (Signed)
                                                                                   Consultation Note Date: 05/13/2019   Patient Name: Kilani Joffe  DOB: Jan 15, 1921  MRN: [id number]  Age / Sex: 85 y.o., female  PCP: Lucianne Lei, MD Referring Physician: Damita Lack, MD  Reason for Consultation: Establishing goals of care  HPI/Patient Profile: 85 y.o. female admitted with COVID19 infection. Her hospital course has been complicated by delirium.  Assessment and Recommendations: Given Mrs. Carpenters moderate to severe delirium he prognosis is overall very poor for making a complete recovery back to her baseline health status. She will likely have persistent neurocognitive decline requiring continuous custodial care and assistance. I will set up a phone meeting to include all of her available children to discuss what QOL Mrs. Clegg would desire given her advanced age and debility.  I support a two physician DNR in the event of terminal decline. She would not survive a resuscitation attempt.  In terms of next steps I will recommend to her family consideration of hospice care-possibly at home depending on what that situation looks like- it is unclear from the record alone if she lived alone or with her family. If their goals are comfort and QOL she may need increased amounts of sedation for agitation and less interventions such as restraints and monitoring equipment that contribute to worsening delirium.  For her delirium I recommend scheduling Tylenol (not prn) for pain and discomfort that can drive agitation and low dose haldol.    Time Total:30 min Greater than 50%  of this time was spent counseling and coordinating care related to the above assessment and plan.  Signed by: Lane Hacker, DO   Please contact Palliative Medicine Team phone at [phone] for questions and concerns.  For individual provider: See Shea Evans

## 2019-05-13 NOTE — Plan of Care (Signed)
  Problem: Clinical Measurements: Goal: Will remain free from infection Outcome: Progressing   Problem: Elimination: Goal: Will not experience complications related to bowel motility Outcome: Progressing   Problem: Pain Managment: Goal: General experience of comfort will improve Outcome: Progressing   Problem: Safety: Goal: Ability to remain free from injury will improve Outcome: Progressing   Problem: Skin Integrity: Goal: Risk for impaired skin integrity will decrease Outcome: Progressing   Problem: Respiratory: Goal: Will maintain a patent airway Outcome: Progressing   Problem: Urinary Elimination: Goal: Signs and symptoms of infection will decrease Outcome: Progressing

## 2019-05-14 DIAGNOSIS — R0603 Acute respiratory distress: Secondary | ICD-10-CM

## 2019-05-14 DIAGNOSIS — J1289 Other viral pneumonia: Secondary | ICD-10-CM

## 2019-05-14 LAB — CBC WITH DIFFERENTIAL/PLATELET
Abs Immature Granulocytes: 0.24 10*3/uL — ABNORMAL HIGH (ref 0.00–0.07)
Basophils Absolute: 0 10*3/uL (ref 0.0–0.1)
Basophils Relative: 0 %
Eosinophils Absolute: 0 10*3/uL (ref 0.0–0.5)
Eosinophils Relative: 0 %
HCT: 42.8 % (ref 36.0–46.0)
Hemoglobin: 13.3 g/dL (ref 12.0–15.0)
Immature Granulocytes: 1 %
Lymphocytes Relative: 2 %
Lymphs Abs: 0.3 10*3/uL — ABNORMAL LOW (ref 0.7–4.0)
MCH: 27.3 pg (ref 26.0–34.0)
MCHC: 31.1 g/dL (ref 30.0–36.0)
MCV: 87.9 fL (ref 80.0–100.0)
Monocytes Absolute: 0.5 10*3/uL (ref 0.1–1.0)
Monocytes Relative: 3 %
Neutro Abs: 17.4 10*3/uL — ABNORMAL HIGH (ref 1.7–7.7)
Neutrophils Relative %: 94 %
Platelets: 412 10*3/uL — ABNORMAL HIGH (ref 150–400)
RBC: 4.87 MIL/uL (ref 3.87–5.11)
RDW: 15.2 % (ref 11.5–15.5)
WBC: 18.5 10*3/uL — ABNORMAL HIGH (ref 4.0–10.5)
nRBC: 0 % (ref 0.0–0.2)

## 2019-05-14 MED ORDER — DEXAMETHASONE SODIUM PHOSPHATE 10 MG/ML IJ SOLN
6.0000 mg | INTRAMUSCULAR | Status: DC
  Administered 2019-05-14 – 2019-05-15 (×2): 6 mg via INTRAVENOUS
  Filled 2019-05-14 (×2): qty 1

## 2019-05-14 MED ORDER — VITAMIN D3 25 MCG PO TABS
1000.0000 [IU] | ORAL_TABLET | Freq: Every day | ORAL | Status: DC
  Filled 2019-05-14 (×2): qty 1

## 2019-05-14 MED ORDER — VITAMIN C 500 MG PO TABS
500.0000 mg | ORAL_TABLET | Freq: Two times a day (BID) | ORAL | Status: DC
  Filled 2019-05-14 (×2): qty 1

## 2019-05-14 MED ORDER — MELATONIN 3 MG PO TABS
3.0000 mg | ORAL_TABLET | Freq: Every day | ORAL | Status: DC
  Filled 2019-05-14 (×3): qty 1

## 2019-05-14 NOTE — Plan of Care (Signed)
Patient noted to be rapidly declining, with decreased LOC and no oral intake. Withdraws from and opens eyes to pain but not verbal response. Respiratory status worsening with labored breathing and increase in O2 requirements (NRB). Producing fair amount of urine but no bowel movement.   Problem: Clinical Measurements: Goal: Will remain free from infection Outcome: Progressing   Problem: Elimination: Goal: Will not experience complications related to urinary retention Outcome: Progressing   Problem: Pain Managment: Goal: General experience of comfort will improve Outcome: Progressing   Problem: Safety: Goal: Ability to remain free from injury will improve Outcome: Progressing   Problem: Coping: Goal: Psychosocial and spiritual needs will be supported Outcome: Progressing   Problem: Respiratory: Goal: Will maintain a patent airway Outcome: Progressing   Problem: Urinary Elimination: Goal: Signs and symptoms of infection will decrease Outcome: Progressing

## 2019-05-14 NOTE — Progress Notes (Addendum)
PROGRESS NOTE    Abigail Patel  ZOX:[id number] DOB: [birth year] DOA: 05/13/2019 PCP: Renaye RakersBland, Veita, MD    Brief Narrative:  85 year old female who presented with weakness.  She does have the significant past medical history for hypertension.  Patient had difficulty walking and dyspnea for about 3 days, positive confusion and decreased p.o. intake.  On her initial physical examination her temperature was 101.9 F blood pressure 109/51, heart rate 76, respiratory rate 18, oxygen saturation 95% on submental oxygen, her lungs had no rhonchi or wheezing, heart S1-S2 present rhythmic, abdomen soft, no lower extremity edema. Sodium 141, potassium 3.1, chloride 111, bicarb 19, glucose 96, BUN 20, creatinine 1.28, white cell count 4.6, hemoglobin 11.8, hematocrit 39.2, platelets 25.  SARS COVID-19 was positive.  Urinalysis negative for infection.  Chest radiograph with a faint interstitial infiltrate left lower lobe (personally reviewed).  Head CT no acute changes.  KG 81 bpm, left axis deviation, normal QRS/QTc, atrial fibrillation, no ST segment or T wave changes.  Patient was admitted to the hospital the working diagnosis of metabolic encephalopathy due to SARS COVID-19 viral pneumonia.  During her hospitalization she developed acute hypoxic respiratory failure, started treatment with systemic steroids and remdesivir.  Radiographic follow-up with progressive disease.  She with persistent increased oxygen requirements.  Her hospitalization has been complicated by metabolic encephalopathy and delirium.  Patient required restraints, to protect lines and tubes.  Due to poor prognosis palliative care services were consulted.  Assessment & Plan:   Principal Problem:   Fever Active Problems:   Essential hypertension   ARF (acute renal failure) (HCC)   Weakness   Thrombocytopenia (HCC)   COVID-19 virus infection   Goals of care, counseling/discussion   1.  Acute hypoxic respiratory failure due to  SARS COVID-19 viral pneumonia.   RR: 29  Pulse oxymetry: 94%  Fi02: 8 LPM per HFNC (from 5 LPM per ).    COVID-19 Labs  Recent Labs    05/12/19 0345 05/13/19 0315  DDIMER 0.49 0.61*  FERRITIN  --  507*  LDH 349* 409*  CRP  --  8.2*    Lab Results  Component Value Date   SARSCOV2NAA POSITIVE (A) 05/12/2019    Continue medical therapy with remdesivir #4/5, systemic corticosteroids with dexamethasone 6 mg Iv daily. Will continue oxymetry monitoring. Ct chest with extensive bilateral interstitial infiltrates.   No evidence of bacterial pneumonia, will dc cefepime and vancomycin for now.   2. Acute metabolic encephalopathy in the setting of COVID 19. Patient with very poor prognosis, has been on restrains due to agitation, not interactive. Continue neuro checks and aspiration precautions, as needed  Halodol. Patient continue to be very weak and deconditioned.   3, AKI. Patient with poor oral intake, renal function with   4. HTN. Amlodipine for blood pressure control. (not getting po medications due to aspiration risk).   5. Moderate to severe calorie protein malnutrition. Poor oral intake with very poor prognosis. Patient with rapid decline in her physical and mental condition. Will consult speech for evaluation.   DVT prophylaxis: heparin  Code Status:  DNR Family Communication: no family at the bedside  Disposition Plan/ discharge barriers: pending family meeting.   Body mass index is 18.09 kg/m. Malnutrition Type:      Malnutrition Characteristics:      Nutrition Interventions:     RN Pressure Injury Documentation:     Consultants:   Palliative  Care  Procedures:     Antimicrobials:  Subjective: Patient not interactive, not following commands, she has been agitated and continue to require soft restrains to protect lines and tubes.   Objective: Vitals:   05/13/19 1933 05/14/19 0000 05/14/19 0440 05/14/19 0732  BP: (!) 117/48  136/68  101/62  Pulse: 71  82 93  Resp: (!) 24 19 20 20   Temp: 97.8 F (36.6 C)  97.9 F (36.6 C) 97.8 F (36.6 C)  TempSrc: Axillary  Axillary Axillary  SpO2: 90% 92% 90% 94%  Weight:      Height:        Intake/Output Summary (Last 24 hours) at 05/14/2019 1033 Last data filed at 05/14/2019 0000 Gross per 24 hour  Intake 400 ml  Output -  Net 400 ml   Filed Weights   05/08/19 0814  Weight: 49.3 kg    Examination:   General: deconditioned and ill looking appearing.  Neurology: on restrains, somnolent, not following commands, agitated. E ENT: positive pallor, no icterus, oral mucosa moist Cardiovascular: No JVD. S1-S2 present, rhythmic, no gallops, rubs, or murmurs. No lower extremity edema. Pulmonary: positive breath sounds bilaterally. Gastrointestinal. Abdomen with no organomegaly, non tender, no rebound or guarding Skin. No rashes Musculoskeletal: no joint deformities     Data Reviewed: I have personally reviewed following labs and imaging studies  CBC: Recent Labs  Lab 05/08/19 0425 05/09/19 0132 05/10/19 0346 05/11/19 0540 05/12/19 0345 05/13/19 0315  WBC 4.6 5.8 4.8 10.2 11.5* 11.4*  NEUTROABS 3.2 4.5 3.8 9.1*  --   --   HGB 14.2 14.0 14.4 14.6 15.2* 14.3  HCT 46.2* 46.1* 47.2* 47.4* 48.9* 46.8*  MCV 87.7 87.6 87.7 86.5 87.2 88.0  PLT 220 222 234 309 380 841   Basic Metabolic Panel: Recent Labs  Lab 05/08/19 0425 05/09/19 0132 05/10/19 0346 05/11/19 0540 05/12/19 0345 05/13/19 0315  NA 144 144 145 146*  --  149*  K 3.2* 4.3 3.8 3.8  --  3.4*  CL 106 107 107 108  --  110  CO2 25 25 27 27   --  23  GLUCOSE 74 96 165* 174*  --  186*  BUN 23 18 28* 29*  --  32*  CREATININE 0.95 0.84 0.79 0.77  --  0.99  CALCIUM 8.6* 8.6* 8.9 9.0  --  8.6*  MG 2.2 2.2 2.3 2.2 2.3 2.3   GFR: Estimated Creatinine Clearance: 25.9 mL/min (by C-G formula based on SCr of 0.99 mg/dL). Liver Function Tests: Recent Labs  Lab 05/08/19 0425 05/09/19 0132 05/10/19 0346  05/11/19 0540 05/13/19 0315  AST 43* 43* 36 35 55*  ALT 18 20 19 21  35  ALKPHOS 49 48 51 53 55  BILITOT 0.6 0.7 0.5 0.3 0.5  PROT 6.9 7.1 7.0 7.4 6.8  ALBUMIN 3.1* 3.0* 2.8* 3.0* 2.9*   No results for input(s): LIPASE, AMYLASE in the last 168 hours. No results for input(s): AMMONIA in the last 168 hours. Coagulation Profile: No results for input(s): INR, PROTIME in the last 168 hours. Cardiac Enzymes: No results for input(s): CKTOTAL, CKMB, CKMBINDEX, TROPONINI in the last 168 hours. BNP (last 3 results) No results for input(s): PROBNP in the last 8760 hours. HbA1C: No results for input(s): HGBA1C in the last 72 hours. CBG: Recent Labs  Lab 05/12/19 1606 05/13/19 0111 05/13/19 0628 05/13/19 0749 05/13/19 1223  GLUCAP 216* 213* 196* 189* 235*   Lipid Profile: No results for input(s): CHOL, HDL, LDLCALC, TRIG, CHOLHDL, LDLDIRECT in the last 72 hours. Thyroid Function Tests: No  results for input(s): TSH, T4TOTAL, FREET4, T3FREE, THYROIDAB in the last 72 hours. Anemia Panel: Recent Labs    05/13/19 0315  FERRITIN 507*      Radiology Studies: I have reviewed all of the imaging during this hospital visit personally     Scheduled Meds: . amLODipine  10 mg Oral Daily  . feeding supplement  1 Container Oral TID BM  . heparin injection (subcutaneous)  5,000 Units Subcutaneous Q8H  . Melatonin  3 mg Oral QHS  . methylPREDNISolone (SOLU-MEDROL) injection  40 mg Intravenous Daily  . [START ON 05/20/2019] vitamin C  500 mg Oral BID  . [START ON 04/29/2019] cholecalciferol  1,000 Units Oral Daily  . zinc sulfate  220 mg Oral Daily   Continuous Infusions: . ceFEPime (MAXIPIME) IV 2 g (05/14/19 1018)  . remdesivir 100 mg in NS 250 mL    . [START ON 05/22/2019] vancomycin       LOS: 8 days        Ehtan Delfavero Annett Gula, MD

## 2019-05-14 NOTE — Progress Notes (Signed)
Restraint order lapsed on patient during night shift.  MD notified and new order placed at 14:00 today.  Restraint interventions continue.

## 2019-05-14 NOTE — Plan of Care (Signed)
  Problem: Clinical Measurements: Goal: Cardiovascular complication will be avoided Outcome: Progressing   Problem: Elimination: Goal: Will not experience complications related to bowel motility Outcome: Progressing Goal: Will not experience complications related to urinary retention Outcome: Progressing   Problem: Pain Managment: Goal: General experience of comfort will improve Outcome: Progressing   Problem: Safety: Goal: Ability to remain free from injury will improve Outcome: Progressing   Problem: Skin Integrity: Goal: Risk for impaired skin integrity will decrease Outcome: Progressing   Problem: Respiratory: Goal: Will maintain a patent airway Outcome: Progressing   Problem: Urinary Elimination: Goal: Signs and symptoms of infection will decrease Outcome: Progressing

## 2019-05-14 NOTE — Progress Notes (Addendum)
Upon entering the room at 2020, patient noted to be saturating 75% on 8L HFNC. Attempted to increase to 15L but O2 sats remained at 80%. NRB placed and patient currently saturating 88-92%. Physician notified. Will increase VS monitoring

## 2019-05-14 NOTE — Progress Notes (Signed)
Spoke with Mallie Mussel and updated him on his mother's condition.  All questions answered at this time.

## 2019-05-15 ENCOUNTER — Inpatient Hospital Stay (HOSPITAL_COMMUNITY): Payer: Medicare Other

## 2019-05-15 DIAGNOSIS — J9601 Acute respiratory failure with hypoxia: Secondary | ICD-10-CM

## 2019-05-15 DIAGNOSIS — G9341 Metabolic encephalopathy: Secondary | ICD-10-CM

## 2019-05-15 DIAGNOSIS — J9600 Acute respiratory failure, unspecified whether with hypoxia or hypercapnia: Secondary | ICD-10-CM

## 2019-05-15 DIAGNOSIS — U071 COVID-19: Secondary | ICD-10-CM

## 2019-05-15 LAB — BASIC METABOLIC PANEL
Anion gap: 13 (ref 5–15)
BUN: 44 mg/dL — ABNORMAL HIGH (ref 8–23)
CO2: 23 mmol/L (ref 22–32)
Calcium: 8 mg/dL — ABNORMAL LOW (ref 8.9–10.3)
Chloride: 117 mmol/L — ABNORMAL HIGH (ref 98–111)
Creatinine, Ser: 1.05 mg/dL — ABNORMAL HIGH (ref 0.44–1.00)
GFR calc Af Amer: 52 mL/min — ABNORMAL LOW (ref >60)
GFR calc non Af Amer: 45 mL/min — ABNORMAL LOW (ref >60)
Glucose, Bld: 257 mg/dL — ABNORMAL HIGH (ref 70–99)
Potassium: 3 mmol/L — ABNORMAL LOW (ref 3.5–5.1)
Sodium: 153 mmol/L — ABNORMAL HIGH (ref 135–145)

## 2019-05-15 MED ORDER — ACETAMINOPHEN 325 MG PO TABS: 650.0000 mg | ORAL_TABLET | Freq: Four times a day (QID) | ORAL | Status: DC | PRN

## 2019-05-15 MED ORDER — CHLORHEXIDINE GLUCONATE 0.12 % MT SOLN
15.0000 mL | Freq: Two times a day (BID) | OROMUCOSAL | Status: DC
  Administered 2019-05-15 (×2): 15 mL via OROMUCOSAL
  Filled 2019-05-15 (×2): qty 15

## 2019-05-15 MED ORDER — ACETAMINOPHEN 650 MG RE SUPP: 650.0000 mg | Freq: Four times a day (QID) | RECTAL | Status: DC | PRN

## 2019-05-15 MED ORDER — GLYCOPYRROLATE 0.2 MG/ML IJ SOLN: 0.2000 mg | INTRAMUSCULAR | Status: DC | PRN

## 2019-05-15 MED ORDER — BIOTENE DRY MOUTH MT LIQD: 15.0000 mL | OROMUCOSAL | Status: DC | PRN

## 2019-05-15 MED ORDER — MORPHINE SULFATE (PF) 2 MG/ML IV SOLN: 2.0000 mg | INTRAVENOUS | Status: DC | PRN

## 2019-05-15 MED ORDER — MORPHINE 100MG IN NS 100ML (1MG/ML) PREMIX INFUSION
2.0000 mg/h | INTRAVENOUS | Status: DC
  Administered 2019-05-15: 1 mg/h via INTRAVENOUS
  Filled 2019-05-15: qty 100

## 2019-05-15 MED ORDER — POLYVINYL ALCOHOL 1.4 % OP SOLN
1.0000 [drp] | Freq: Four times a day (QID) | OPHTHALMIC | Status: DC | PRN
  Filled 2019-05-15: qty 15

## 2019-05-15 MED ORDER — ORAL CARE MOUTH RINSE
15.0000 mL | Freq: Two times a day (BID) | OROMUCOSAL | Status: DC
  Administered 2019-05-15 (×2): 15 mL via OROMUCOSAL

## 2019-05-15 MED ORDER — SODIUM CHLORIDE 0.9 % IV SOLN
3.0000 g | Freq: Two times a day (BID) | INTRAVENOUS | Status: DC
  Administered 2019-05-15: 3 g via INTRAVENOUS
  Filled 2019-05-15 (×3): qty 8

## 2019-05-15 MED ORDER — SODIUM CHLORIDE 0.9 % IV SOLN
INTRAVENOUS | Status: DC
  Administered 2019-05-15 (×2): via INTRAVENOUS

## 2019-05-15 MED ORDER — GLYCOPYRROLATE 1 MG PO TABS
1.0000 mg | ORAL_TABLET | ORAL | Status: DC | PRN
  Filled 2019-05-15: qty 1

## 2019-05-27 NOTE — Progress Notes (Signed)
PROGRESS NOTE    Abigail Patel  YQM:[id number] DOB: [birth year] DOA: 05/01/2019 PCP: Renaye RakersBland, Veita, MD    Brief Narrative:  85 year old female who presented with weakness.  She does have the significant past medical history for hypertension.  Patient had difficulty walking and dyspnea for about 3 days, positive confusion and decreased p.o. intake.  On her initial physical examination her temperature was 101.9 F blood pressure 109/51, heart rate 76, respiratory rate 18, oxygen saturation 95% on submental oxygen, her lungs had no rhonchi or wheezing, heart S1-S2 present rhythmic, abdomen soft, no lower extremity edema. Sodium 141, potassium 3.1, chloride 111, bicarb 19, glucose 96, BUN 20, creatinine 1.28, white cell count 4.6, hemoglobin 11.8, hematocrit 39.2, platelets 25.  SARS COVID-19 was positive.  Urinalysis negative for infection.  Chest radiograph with a faint interstitial infiltrate left lower lobe (personally reviewed).  Head CT no acute changes.  KG 81 bpm, left axis deviation, normal QRS/QTc, atrial fibrillation, no ST segment or T wave changes.  Patient was admitted to the hospital the working diagnosis of metabolic encephalopathy due to SARS COVID-19 viral pneumonia.  During her hospitalization she developed acute hypoxic respiratory failure, started treatment with systemic steroids and remdesivir.  Radiographic follow-up with progressive disease.  She with persistent increased oxygen requirements.  Her hospitalization has been complicated by metabolic encephalopathy and delirium.  Patient required restraints, to protect lines and tubes.  Due to poor prognosis palliative care services were consulted.  Patient now with rapid worsening hypoxic respiratory failure, dense infiltrate left upper lobe, refractive hypoxemia, poor prognosis, imminent death.  Her family has been contacted.  Patient not candidate for invasive mechanical ventilation.  Assessment & Plan:   Principal Problem:    Pneumonia due to COVID-19 virus Active Problems:   Essential hypertension   ARF (acute renal failure) (HCC)   Weakness   Thrombocytopenia (HCC)   COVID-19 virus infection   Goals of care, counseling/discussion   Acute metabolic encephalopathy   Respiratory failure, acute (HCC)    1.  Acute hypoxic respiratory failure due to SARS COVID-19 viral pneumonia.  RR: 16 to 22 Pulse oxymetry: 89 to 90%  Fi02: 100% on non rebreather mask 15 LPM   COVID-19 Labs  Recent Labs    05/13/19 0315  DDIMER 0.61*  FERRITIN 507*  LDH 409*  CRP 8.2*    Lab Results  Component Value Date   SARSCOV2NAA POSITIVE (A) 05/03/2019    Patient overnight had increased oxygen requirements, with increased work of breathing and significant dyspnea, this am on 100% FiO2 per nonrebreather plus supplemental oxygen per high flow nasal cannula her oxygen saturation is 66%.  A follow-up chest radiograph which was personally reviewed showed a dense infiltrate in the left upper lobe with positive air bronchogram.  Likely patient has now a bacterial over infection, she can not protect her airway, with thick secretions unable to clear, due to her severe deconditioning and rapid physical and mental decline.   Currently patient is in respiratory distress, I will call her daughter Ms. Bessie, while me being at bedside.  I explained her about her mom's condition being very critical, acute respiratory distress and suffering, low chances of recovery and imminent death.  She is not a candidate for invasive mechanical ventilation due to her poor prognosis, debility and aggressive COVID-19 disease.  For now will continue medical therapy with remdesivir #5/5, systemic corticosteroids with dexamethasone 6 mg Iv daily.  Add Unasyn for antibiotic therapy.  Will continue morphine for respiratory distress.  This plan was discussed with her daughter Marnette Burgess who agrees.   2. Acute metabolic encephalopathy in the setting of  COVID 19.  Patient continue unresponsive, grimaced to touch, not following commands, very poor prognosis. Continue supportive medical care and as needed morphine.   3, AKI. Added gentle isotonic saline for hydration.   4. HTN. Unable to take po medications, blood pressure is 107/50 .   5. Moderate to severe calorie protein malnutrition. Patient with poor oral intake, rapid decline in physical and mental status.   DVT prophylaxis: heparin  Code Status:  DNR Family Communication: I spoke with patient's daughter as above.  Disposition Plan/ discharge barriers: patient critically ill with imminent death.      Body mass index is 18.09 kg/m. Malnutrition Type:      Malnutrition Characteristics:      Nutrition Interventions:     RN Pressure Injury Documentation:     Consultants:     Procedures:     Antimicrobials:   Unasyn    Subjective: Patient in severe respiratory distress, continue to be unresponsive, increased oxygen requirements.   Objective: Vitals:   04/29/2019 0245 05/26/2019 0254 05/12/2019 0431 05/14/2019 0823  BP: 99/72  (!) 114/47 (!) 130/33  Pulse: 72  77 62  Resp: 18  (!) 22 16  Temp:   (!) 97.2 F (36.2 C) 97.7 F (36.5 C)  TempSrc:   Axillary Axillary  SpO2:  (!) 88% (!) 89% 90%  Weight:      Height:        Intake/Output Summary (Last 24 hours) at 05/16/2019 0852 Last data filed at 05/22/2019 0600 Gross per 24 hour  Intake 545.9 ml  Output -  Net 545.9 ml   Filed Weights   05/08/19 0814  Weight: 49.3 kg    Examination:   General: deconditioned, in respiratory distress, ill looking appearing.  Neurology: Awake and alert, non focal  E ENT: positive pallor, no icterus, oral mucosa dry.  Cardiovascular: No JVD. S1-S2 present, rhythmic, no gallops, rubs, or murmurs. No lower extremity edema. Pulmonary: positice breath sounds bilaterally, diffuse rhonchi bilaterally. Gastrointestinal. Abdomen with no organomegaly, non tender, no  rebound or guarding Skin. No rashes Musculoskeletal: no joint deformities     Data Reviewed: I have personally reviewed following labs and imaging studies  CBC: Recent Labs  Lab 05/09/19 0132 05/10/19 0346 05/11/19 0540 05/12/19 0345 05/13/19 0315 05/14/19 1735  WBC 5.8 4.8 10.2 11.5* 11.4* 18.5*  NEUTROABS 4.5 3.8 9.1*  --   --  17.4*  HGB 14.0 14.4 14.6 15.2* 14.3 13.3  HCT 46.1* 47.2* 47.4* 48.9* 46.8* 42.8  MCV 87.6 87.7 86.5 87.2 88.0 87.9  PLT 222 234 309 380 390 269*   Basic Metabolic Panel: Recent Labs  Lab 05/09/19 0132 05/10/19 0346 05/11/19 0540 05/12/19 0345 05/13/19 0315 05/25/2019 0610  NA 144 145 146*  --  149* 153*  K 4.3 3.8 3.8  --  3.4* 3.0*  CL 107 107 108  --  110 117*  CO2 25 27 27   --  23 23  GLUCOSE 96 165* 174*  --  186* 257*  BUN 18 28* 29*  --  32* 44*  CREATININE 0.84 0.79 0.77  --  0.99 1.05*  CALCIUM 8.6* 8.9 9.0  --  8.6* 8.0*  MG 2.2 2.3 2.2 2.3 2.3  --    GFR: Estimated Creatinine Clearance: 24.4 mL/min (A) (by C-G formula based on SCr of 1.05 mg/dL (H)). Liver Function Tests: Recent Labs  Lab 05/09/19 0132 05/10/19 0346 05/11/19 0540 05/13/19 0315  AST 43* 36 35 55*  ALT 20 19 21  35  ALKPHOS 48 51 53 55  BILITOT 0.7 0.5 0.3 0.5  PROT 7.1 7.0 7.4 6.8  ALBUMIN 3.0* 2.8* 3.0* 2.9*   No results for input(s): LIPASE, AMYLASE in the last 168 hours. No results for input(s): AMMONIA in the last 168 hours. Coagulation Profile: No results for input(s): INR, PROTIME in the last 168 hours. Cardiac Enzymes: No results for input(s): CKTOTAL, CKMB, CKMBINDEX, TROPONINI in the last 168 hours. BNP (last 3 results) No results for input(s): PROBNP in the last 8760 hours. HbA1C: No results for input(s): HGBA1C in the last 72 hours. CBG: Recent Labs  Lab 05/12/19 1606 05/13/19 0111 05/13/19 0628 05/13/19 0749 05/13/19 1223  GLUCAP 216* 213* 196* 189* 235*   Lipid Profile: No results for input(s): CHOL, HDL, LDLCALC, TRIG,  CHOLHDL, LDLDIRECT in the last 72 hours. Thyroid Function Tests: No results for input(s): TSH, T4TOTAL, FREET4, T3FREE, THYROIDAB in the last 72 hours. Anemia Panel: Recent Labs    05/13/19 0315  FERRITIN 507*      Radiology Studies: I have reviewed all of the imaging during this hospital visit personally     Scheduled Meds: . amLODipine  10 mg Oral Daily  . chlorhexidine  15 mL Mouth Rinse BID  . dexamethasone (DECADRON) injection  6 mg Intravenous Q24H  . feeding supplement  1 Container Oral TID BM  . heparin injection (subcutaneous)  5,000 Units Subcutaneous Q8H  . mouth rinse  15 mL Mouth Rinse q12n4p  . Melatonin  3 mg Oral QHS  . vitamin C  500 mg Oral BID  . cholecalciferol  1,000 Units Oral Daily  . zinc sulfate  220 mg Oral Daily   Continuous Infusions: . sodium chloride 50 mL/hr at 05-17-19 0042  . remdesivir 100 mg in NS 250 mL Stopped (05/14/19 1130)     LOS: 9 days        Milla Wahlberg 05/16/19, MD

## 2019-05-27 NOTE — Progress Notes (Addendum)
Daughter Marnette Burgess called for updates at 2130. Explained to Abigail Patel that the patient is not alert, with no oral intake and worsening respiratory status. This RN offered Abigail Patel the opportunity to facetime to see her mother. During facetime, Abigail Patel repeatedly told her mother that she "needs to eat" and she "doesn't look that bad." This RN prepared Abigail Patel for possible transfer to ICU should her mother's condition worsen any further. Abigail Patel remains positive about mother's outlook.  0000: Dr. Shanon Brow paged asking if patient should be started on IVF due to no oral intake for >24 hrs. Awaiting response.  0243: Pt unable to clear secretions from back of throat. Attempted to suction secretions with some success, but pt satting 82-86% on NRB. BP 106/53 (58). Second cycle of 99/72 (78). Dr Shanon Brow was paged asking if transfer to ICU for HHFNC/NRB was an option. Dr. Shanon Brow stated "lets see how she does over the next couple hours." Respiratory therapy consulted. NT suctioning order obtained.

## 2019-05-27 NOTE — Care Management (Signed)
05/16/19 8:39am  Chart accessed by Case manager . May God rest her soul and comfort her family.  Ricki Miller, RN Case Manager [phone]

## 2019-05-27 NOTE — Progress Notes (Signed)
SLP Cancellation Note  Patient Details Name: Abigail Patel MRN: [id number] DOB: 28-Jun-1920   Cancelled eval:    Orders received for swallow evaluation.  Pt on non-rebreather; somnolent - unable to take POs. SLP will follow along - pt has seen dramatic decline last 48 hours and Palliative Medicine is on board.  Marcelo Ickes L. Tivis Ringer, [place] CCC/SLP Acute Rehabilitation Services Office number [phone] Secure chat for communication                                                                                             Assunta Curtis 04/30/2019, 9:59 AM

## 2019-05-27 NOTE — Progress Notes (Signed)
Pt is desating to low 60s provider (Dr. Cathlean Sauer) was on the floor, so he was called in the room to come assess the pt.  HR is dropping to high 40s and then going up to 123 hightest. Pt is on NRB 15L and I added HFNC 15L,. Her sats are still low. RT is at bedside NT suctioning pt. Provider called the daughter, so they can make a decision about making her comfort care. Will Continue to monitor.

## 2019-05-27 NOTE — Progress Notes (Addendum)
1930: Bilateral soft wrist restraints removed with day shift RN Ndeye during shift report after confirming patient was comfort care. Skin intact and no complications noted.  2043/10/31: Jonelle Sports RN and Rush Farmer RN pronounced time of death at 2042/10/31. Physician notified, Marnette Burgess (daughter of patient) notified, and Bessie to make funeral home arrangements in the morning.  Morphine gtts wasted, 113ml emptied into stericycle by this RN with Warrick Parisian RN as witness

## 2019-05-27 NOTE — Progress Notes (Signed)
Pharmacy Antibiotic Note  Abigail Patel is a 85 y.o. female admitted on 05/13/2019 with pneumonia.  Pharmacy has been consulted for cefepime and vancomycin dosing.  Plan: Unasyn 3g IV q12h Follow up renal function, culture results, and clinical course.   Height: 5\' 5"  (165.1 cm) Weight: 108 lb 11 oz (49.3 kg) IBW/kg (Calculated) : 57  Temp (24hrs), Avg:97.6 F (36.4 C), Min:97.2 F (36.2 C), Max:97.9 F (36.6 C)  Recent Labs  Lab 05/09/19 0132 05/10/19 0346 05/11/19 0540 05/12/19 0345 05/13/19 0315 05/14/19 1735 23-May-2019 0610  WBC 5.8 4.8 10.2 11.5* 11.4* 18.5*  --   CREATININE 0.84 0.79 0.77  --  0.99  --  1.05*    Estimated Creatinine Clearance: 24.4 mL/min (A) (by C-G formula based on SCr of 1.05 mg/dL (H)).    No Known Allergies  Antimicrobials this admission: 11/15 Remdesivir >> 11/19 11/17 Cefepime >> 11/18 11/17 Vancomycin >>11/18 11/19 Unasyn >>   Dose adjustments this admission:   Microbiology results: 11/9 SARS CoV2:  positive  Thank you for allowing pharmacy to be a part of this patient's care.  Gretta Arab PharmD, BCPS Clinical pharmacist phone 7am- 5pm: [phone] May 23, 2019 11:15 AM

## 2019-05-27 NOTE — Progress Notes (Signed)
RT called by Charge RN to assess pt and to add a splitter to flowmeter to allow both devices to be used. Pt with increased WOB, unresponsive, coarse crackles, audible gurgling. Pt appears to be struggling to breathe. RT NT suctioned pt with small amount of yellow secretions suctioned out of L nare and nothing out of R nare. Pt was comfort care but family changed their mind, after speaking with RN and Agricultural consultant, pt would benefit from full comfort care and morphine drip to help her breathe better and be more comfortable

## 2019-05-27 NOTE — Progress Notes (Deleted)
Pt unable to clear secretions from back of throat. Attempted to suction secretions with some success, but pt satting 82-86% on NRB. BP 106/53 (58). Second cycle of 99/72 (78). Dr Shanon Brow was paged asking if transfer to ICU for HHFNC/NRB was an option. Dr. Shanon Brow stated "lets see how she does over the next couple hours." Respiratory therapy consulted. NT suctioning order obtained.

## 2019-05-27 NOTE — Death Summary Note (Addendum)
Death Summary  Abigail Patel QQP:[id number] DOB: Jun 14, 1921 DOA: 05/13/19  PCP: Renaye Rakers, MD  Admit date: 05/13/2019 Date of Death: May 23, 2019 Time of Death: 20:44 Notification: Renaye Rakers, MD notified of death of 05/24/19   History of present illness:  Abigail Patel is a 85 y.o. female with a history of hypertension. Abigail Patel presented with complaint of weakness.   Patient had difficulty walking and dyspnea for about 3 days, positive confusion and decreased p.o. intake. On her initial physical examination her temperature was 101.75F blood pressure 109/51, heart rate 76, respiratory rate 18, oxygen saturation 95% on supplemental oxygen, her lungs had no rhonchi or wheezing, heart S1-S2 present irregularly irregular, abdomen soft, no lower extremity edema. Sodium 141, potassium 3.1, chloride 111, bicarb 19, glucose 96, BUN 20, creatinine 1.28,white cell count 4.6,hemoglobin 11.8, hematocrit 39.2, platelets 25.SARS COVID-19 was positive.Urinalysis negative for infection. Chest radiograph with a faint interstitial infiltrate left lower lobe (personally reviewed).Head CT no acute changes. EKG 81 bpm, left axis deviation, normal QRS/QTc, atrial fibrillation, no ST segment or T wave changes.  Patient was admitted to the hospital with the working diagnosis of acute metabolic encephalopathy due to SARS COVID-19 viral pneumonia.  During her hospitalization she developedacute hypoxic respiratory failure, started treatment with systemic steroids and remdesivir. Radiographic follow-up with progressive disease. She had persistent increase oxygen requirements.  Her hospitalization was complicated by metabolic encephalopathy and delirium.Patient required restraints, to protect lines and tubes. Due to poor prognosis palliative care services were consulted.  Patient had a rapid and worsening hypoxic respiratory failure, follow up chest radiograph with a dense infiltrate  left upper lobe, with refractive hypoxemia, poor prognosis, and imminent death. Patient was not candidate for invasive mechanical ventilation. Her family was contacted and considering patient's poor prognosis and respiratory distress, palliative care was followed.    Abigail Patel did not improve after aggressive medical therapy including , supplemental oxygen, systemic steroids, antivirals and antibiotics.  Final Diagnoses:  1.   Severe acute hypoxic respiratory failure due to left upper lobe pneumonia. 2.  SARS COVID-19 viral pneumonia. 3.  Metabolic encephalopathy. 4.  Acute kidney injury 5.  Moderate severe calorie protein malnutrition. 6.  Hypertension   The results of significant diagnostics from this hospitalization (including imaging, microbiology, ancillary and laboratory) are listed below for reference.    Significant Diagnostic Studies: Dg Chest 1 View  Result Date: May 23, 2019 CLINICAL DATA:  Dyspnea. EXAM: CHEST  1 VIEW COMPARISON:  CT angiography of 05/13/2019 and chest x-ray 05/11/2019 FINDINGS: Interstitial and airspace process with patchy ground-glass and subtle nodularity with increase in confluence particularly in the left mid chest when compared to the scout image from the CT exam of 05/13/2019 Cardiomediastinal contours are unchanged. No acute bone finding. IMPRESSION: Suspect worsening of parenchymal findings related to atypical or viral pneumonia. Superimposed pulmonary edema is also considered. Electronically Signed   By: Donzetta Kohut M.D.   On: 2019/05/23 10:16   Ct Head Wo Contrast  Result Date: 05/06/2019 CLINICAL DATA:  Altered mental status for several days EXAM: CT HEAD WITHOUT CONTRAST TECHNIQUE: Contiguous axial images were obtained from the base of the skull through the vertex without intravenous contrast. COMPARISON:  None. FINDINGS: Brain: Atrophic and chronic white matter ischemic changes are noted. No findings to suggest acute hemorrhage, acute  infarction or space-occupying mass lesion are noted. Vascular: No hyperdense vessel or unexpected calcification. Skull: Normal. Negative for fracture or focal lesion. Sinuses/Orbits: Mild mucosal thickening is noted within the right maxillary antrum. Other:  None. IMPRESSION: Chronic atrophic and ischemic changes without acute abnormality. Electronically Signed   By: Alcide CleverMark  Lukens M.D.   On: 05/06/2019 01:34   Ct Angio Chest Pe W Or Wo Contrast  Result Date: 05/13/2019 CLINICAL DATA:  Altered mental status. Chest pain. COVID-19 positive. Acute renal failure. Hypertension. EXAM: CT ANGIOGRAPHY CHEST WITH CONTRAST TECHNIQUE: Multidetector CT imaging of the chest was performed using the standard protocol during bolus administration of intravenous contrast. Multiplanar CT image reconstructions and MIPs were obtained to evaluate the vascular anatomy. CONTRAST:  100mL OMNIPAQUE IOHEXOL 350 MG/ML SOLN COMPARISON:  Multiple chest radiographs, most recent 05/11/2019. FINDINGS: Cardiovascular: The quality of this exam for evaluation of pulmonary embolism is moderate. Although the bolus is well timed, respiratory motion and patient arm position (not raised above the head) degrade evaluation. No pulmonary embolism to the segmental level. Aortic and branch vessel atherosclerosis. No aortic dissection. Mild cardiomegaly, without pericardial effusion. Multivessel coronary artery atherosclerosis. Mediastinum/Nodes: No mediastinal or hilar adenopathy. Tiny hiatal hernia. Anterior mediastinal soft tissue density, including at 2.7 x 1.4 cm on 55/9. Lungs/Pleura: Trace left pleural thickening. Relatively diffuse, bilateral ground-glass and airspace disease with septal thickening. Relative sparing of the apices and caudal most lungs. Upper Abdomen: Hepatic steatosis. Caudate lobe cyst. Grossly normal imaged spleen, pancreas, gallbladder, adrenal glands. Motion degradation continuing into the upper abdomen. 4.5 cm upper pole left  renal cyst. Grossly normal right kidney. Abdominal aortic atherosclerosis. Musculoskeletal: Contrast extravasation is incompletely imaged in the left axilla and left upper extremity, including on 55/9. No acute osseous abnormality. Review of the MIP images confirms the above findings. IMPRESSION: 1. Motion and patient arm position degradation. 2. No pulmonary embolism with above limitations. 3. Diffuse ground-glass and airspace opacity, likely related to extensive COVID-19 pneumonia. 4. Coronary artery atherosclerosis. Aortic Atherosclerosis (ICD10-I70.0). 5. Hepatic steatosis. 6. Contrast extravasation in the left upper extremity. The technologists will be made aware of this fact and the appropriate patient treatment steps performed. 7. Anterior mediastinal soft tissue density is indeterminate. Considerations include thymic hyperplasia, thymoma, thymic carcinoma, and adenopathy. Of questionable clinical significance, given patient age and comorbidities. Electronically Signed   By: Jeronimo GreavesKyle  Talbot M.D.   On: 05/13/2019 18:27   Dg Chest Port 1 View  Result Date: 05/11/2019 CLINICAL DATA:  Dyspnea EXAM: PORTABLE CHEST 1 VIEW COMPARISON:  Multiple exams, including levin/13/20 FINDINGS: Mild interstitial accentuation both lung bases without well-defined airspace opacity. The patient is rotated to the right on today's radiograph, reducing diagnostic sensitivity and specificity. No significant blunting of the costophrenic angles. Chronic rightward tracheal deviation. IMPRESSION: 1. Mild nonspecific interstitial accentuation in the lung bases, without well-defined airspace opacity. 2. Chronic rightward tracheal deviation. Electronically Signed   By: Gaylyn RongWalter  Liebkemann M.D.   On: 05/11/2019 16:27   Dg Chest Port 1 View  Result Date: 05/09/2019 CLINICAL DATA:  Shortness of breath. EXAM: PORTABLE CHEST 1 VIEW COMPARISON:  Chest x-ray 11/22/2018. FINDINGS: Patient rotated to the right. Trachea shifted to the right, this  may be secondary to prominent thoracic aorta and patient rotation. No interim change. Heart size stable. Mild bibasilar atelectasis/infiltrates. No pleural effusion or pneumothorax. IMPRESSION: Mild bibasilar atelectasis/infiltrates. Electronically Signed   By: Maisie Fushomas  Register   On: 05/09/2019 11:59   Dg Chest Port 1 View  Result Date: 05/12/2019 CLINICAL DATA:  Weakness EXAM: PORTABLE CHEST 1 VIEW COMPARISON:  08/22/2016 FINDINGS: No focal airspace disease or effusion. Stable cardiomediastinal silhouette. Mild diffuse coarse interstitial opacity likely chronic change. Aortic atherosclerosis. No pneumothorax. IMPRESSION:  No active disease. Mild diffuse coarse interstitial opacity likely chronic change Electronically Signed   By: Donavan Foil M.D.   On: May 25, 2019 18:16    Microbiology: No results found for this or any previous visit (from the past 240 hour(s)).   Labs: Basic Metabolic Panel: Recent Labs  Lab 05/10/19 0346 05/11/19 0540 05/12/19 0345 05/13/19 0315 05/10/2019 0610  NA 145 146*  --  149* 153*  K 3.8 3.8  --  3.4* 3.0*  CL 107 108  --  110 117*  CO2 27 27  --  23 23  GLUCOSE 165* 174*  --  186* 257*  BUN 28* 29*  --  32* 44*  CREATININE 0.79 0.77  --  0.99 1.05*  CALCIUM 8.9 9.0  --  8.6* 8.0*  MG 2.3 2.2 2.3 2.3  --    Liver Function Tests: Recent Labs  Lab 05/10/19 0346 05/11/19 0540 05/13/19 0315  AST 36 35 55*  ALT 19 21 35  ALKPHOS 51 53 55  BILITOT 0.5 0.3 0.5  PROT 7.0 7.4 6.8  ALBUMIN 2.8* 3.0* 2.9*   No results for input(s): LIPASE, AMYLASE in the last 168 hours. No results for input(s): AMMONIA in the last 168 hours. CBC: Recent Labs  Lab 05/10/19 0346 05/11/19 0540 05/12/19 0345 05/13/19 0315 05/14/19 1735  WBC 4.8 10.2 11.5* 11.4* 18.5*  NEUTROABS 3.8 9.1*  --   --  17.4*  HGB 14.4 14.6 15.2* 14.3 13.3  HCT 47.2* 47.4* 48.9* 46.8* 42.8  MCV 87.7 86.5 87.2 88.0 87.9  PLT 234 309 380 390 412*   Cardiac Enzymes: No results for  input(s): CKTOTAL, CKMB, CKMBINDEX, TROPONINI in the last 168 hours. D-Dimer No results for input(s): DDIMER in the last 72 hours. BNP: Invalid input(s): POCBNP CBG: Recent Labs  Lab 05/12/19 1606 05/13/19 0111 05/13/19 0628 05/13/19 0749 05/13/19 1223  GLUCAP 216* 213* 196* 189* 235*   Anemia work up No results for input(s): VITAMINB12, FOLATE, FERRITIN, TIBC, IRON, RETICCTPCT in the last 72 hours. Urinalysis    Component Value Date/Time   COLORURINE YELLOW 05-25-19 2255   APPEARANCEUR HAZY (A) May 25, 2019 2255   LABSPEC 1.014 05-25-2019 2255   PHURINE 5.0 05-25-19 2255   GLUCOSEU NEGATIVE 2019-05-25 2255   HGBUR NEGATIVE 2019-05-25 2255   BILIRUBINUR NEGATIVE 05/25/2019 2255   KETONESUR 5 (A) May 25, 2019 2255   PROTEINUR NEGATIVE 05-25-2019 2255   NITRITE NEGATIVE 05-25-2019 2255   LEUKOCYTESUR NEGATIVE 05/25/19 2255   Sepsis Labs Invalid input(s): PROCALCITONIN,  WBC,  LACTICIDVEN     SIGNED:  Tawni Millers, MD  [facility] 05/16/2019, 3:48 PM Pager   If 7PM-7AM, please contact night-coverage [email or web address] Password [email or web address]

## 2019-05-27 NOTE — Progress Notes (Signed)
RT NTS ptX2 with copious return, pt tolerated procedure fairly well with slight anxiety and Desat.RT to continue to monitor as needed

## 2019-05-27 NOTE — Progress Notes (Signed)
Patient continue to have significant respiratory distress despite using boluses of morphine and lorazepam, I did contact patient's daughter Ms. Bessie, updated her about patient's condition.  The patient's family is agreeing in continue comfort measures, no further escalation of treatment.  They are aware patient has high risk of worsening and imminent death.  I discussed the option of doing a continuous morphine drip, in order to achieve patient's comfort, and relief further respiratory distress.  She agrees with this intervention.   Considering patient's imminent death family will be allowed to visit  Mrs. Nakira.

## 2019-05-27 NOTE — Progress Notes (Signed)
Spoke with daughter and gave her an update on the pt  she is appreciative of it

## 2019-05-27 DEATH — deceased

## 2021-05-18 IMAGING — CT CT HEAD W/O CM
4 series · 17 of 47 positions shown, 19 images · non-contrast
Comparison: None.

CLINICAL DATA: Altered mental status for several days

EXAM:
CT HEAD WITHOUT CONTRAST
TECHNIQUE: Contiguous axial images were obtained from the base of the skull
through the vertex without intravenous contrast.

[Series 3: head without · axial · non-contrast · 0.42mm/px · z∈[+1392,+1512]mm · 7 of 34 slices shown, 9 images]
[im 5/34  brain]
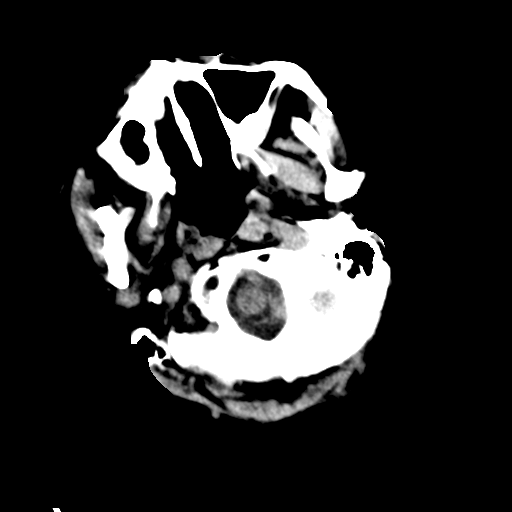
[im 5/34  bone]
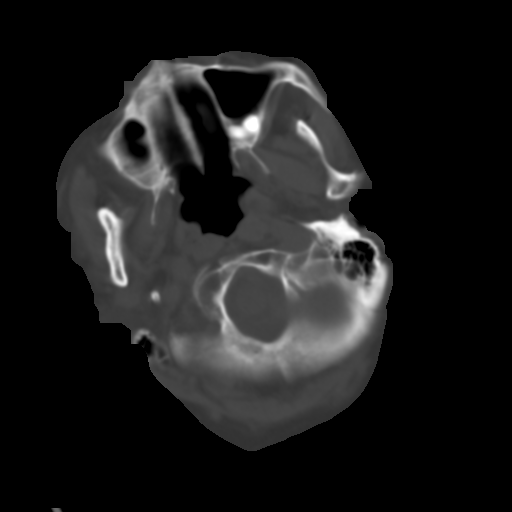
[im 9/34  brain]
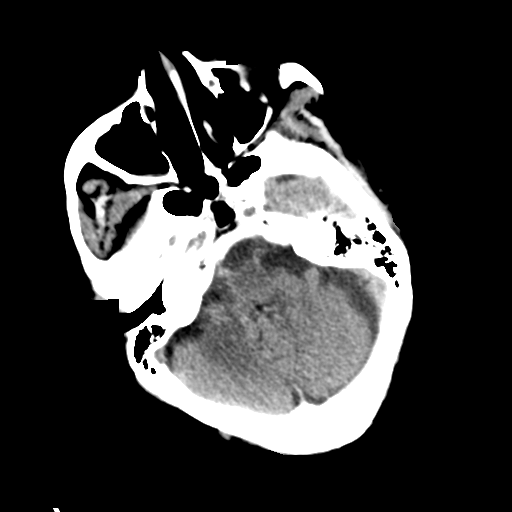
[im 13/34  brain]
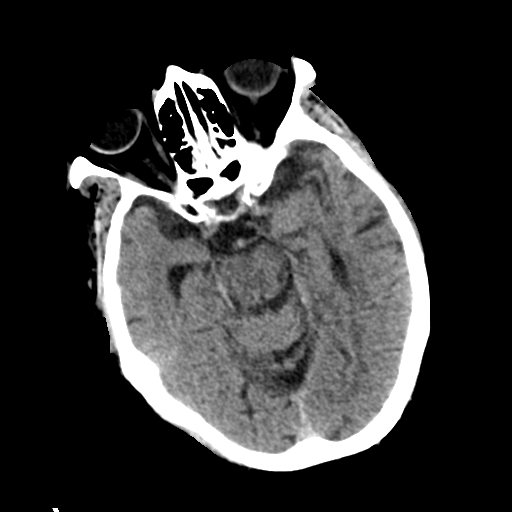
[im 17/34  brain]
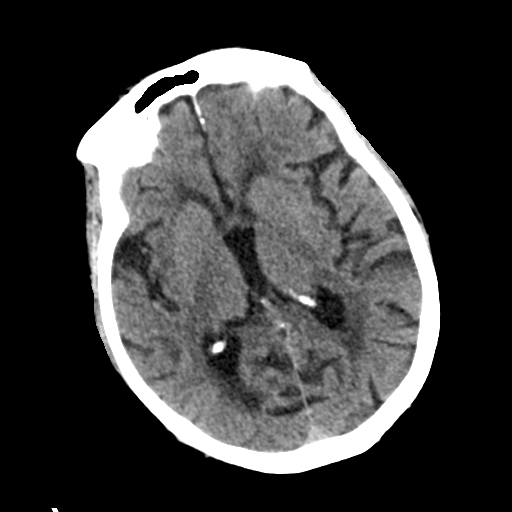
[im 21/34  brain]
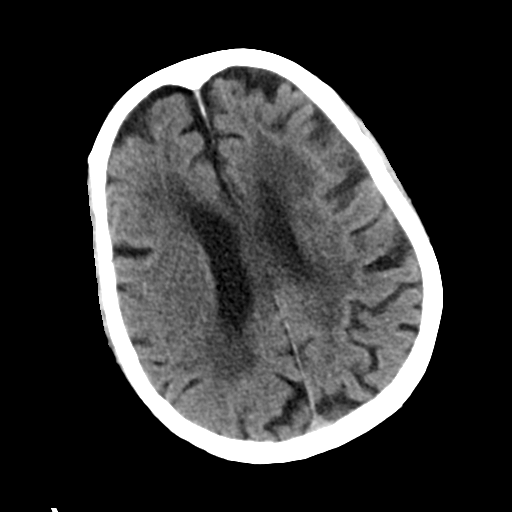
[im 21/34  bone]
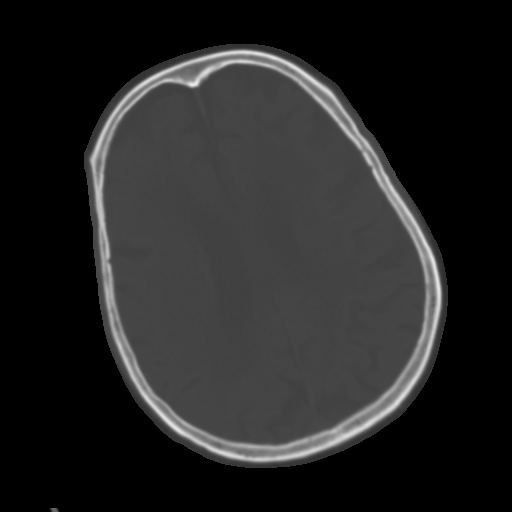
[im 25/34  brain]
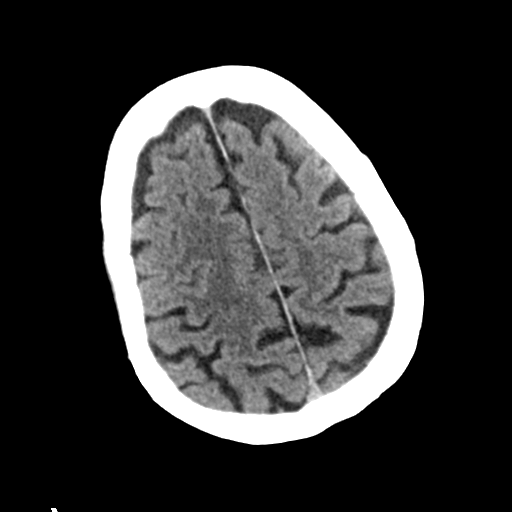
[im 29/34  brain]
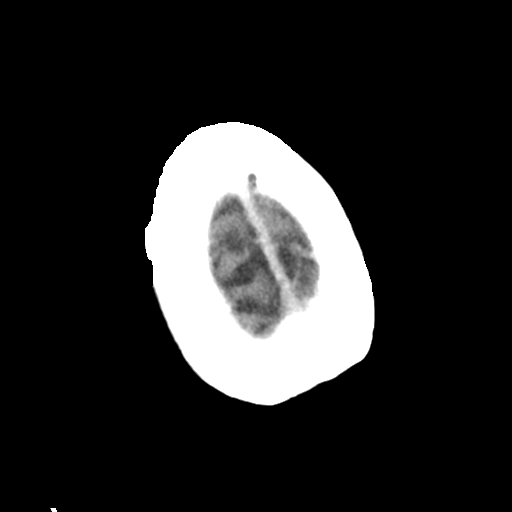

[Series 4: head bone · axial · 0.42mm/px · z∈[+1388,+1446]mm · 4 of 85 slices shown]
[im 9/85  bone]
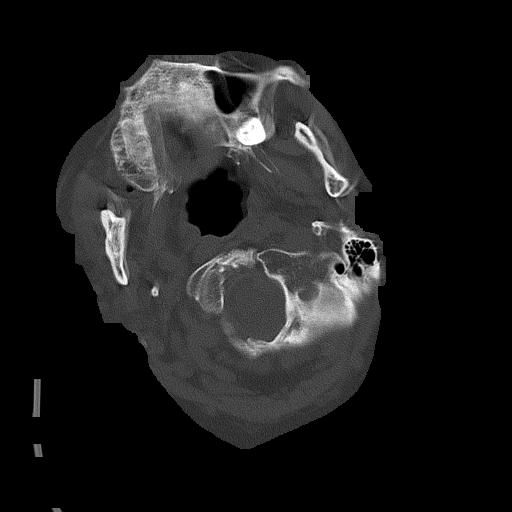
[im 17/85  bone]
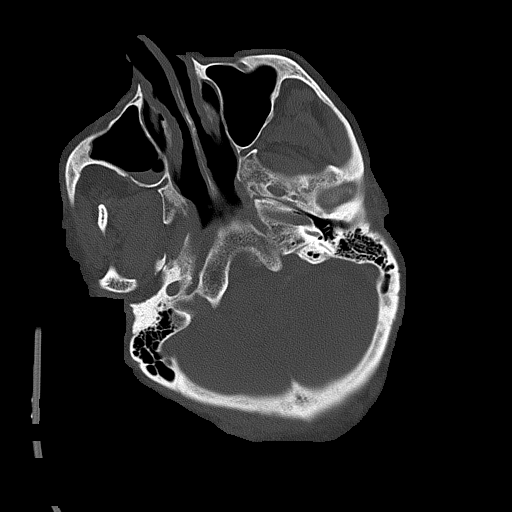
[im 26/85  bone]
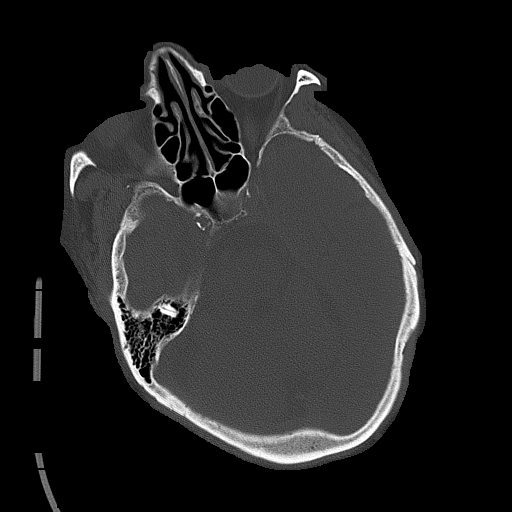
[im 38/85  bone]
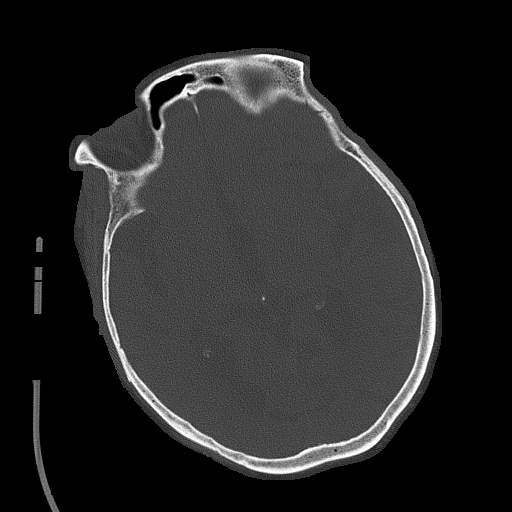

[Series 5: head without cor · coronal · non-contrast · 0.33mm/px · 3 of 67 slices shown]
[im 23/67  brain]
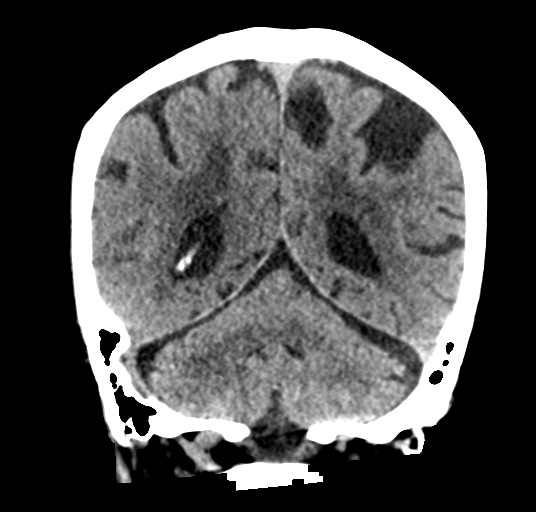
[im 30/67  brain]
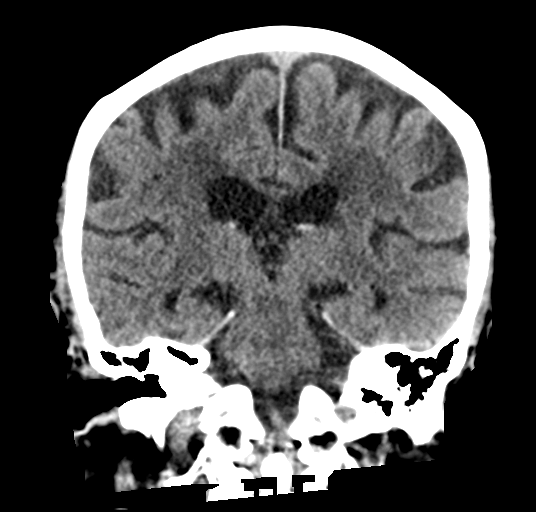
[im 37/67  brain]
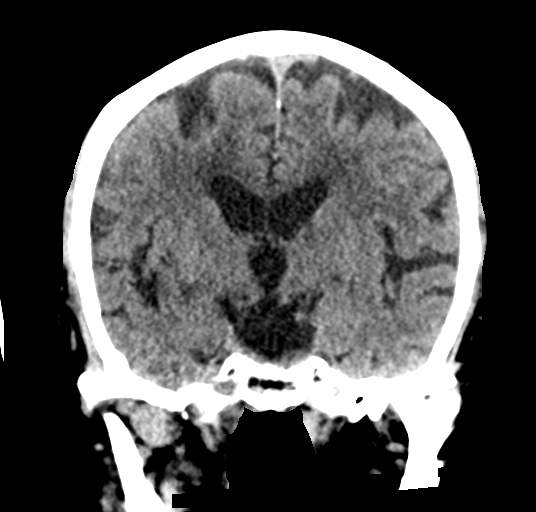

[Series 6: head without sag · sagittal · non-contrast · 0.33mm/px · 3 of 54 slices shown]
[im 19/54  brain]
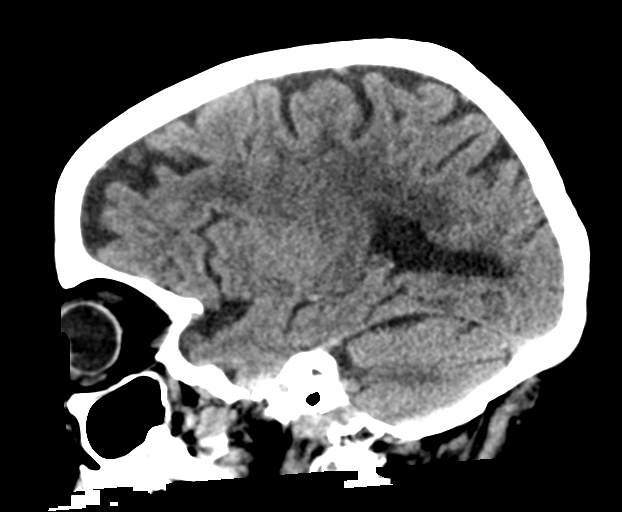
[im 27/54  brain]
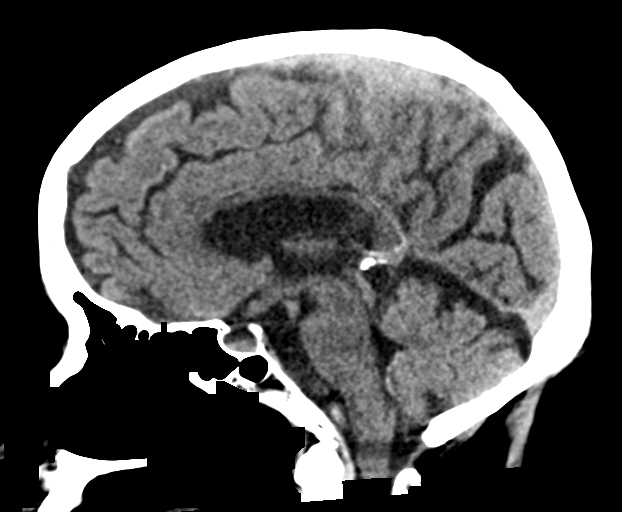
[im 34/54  brain]
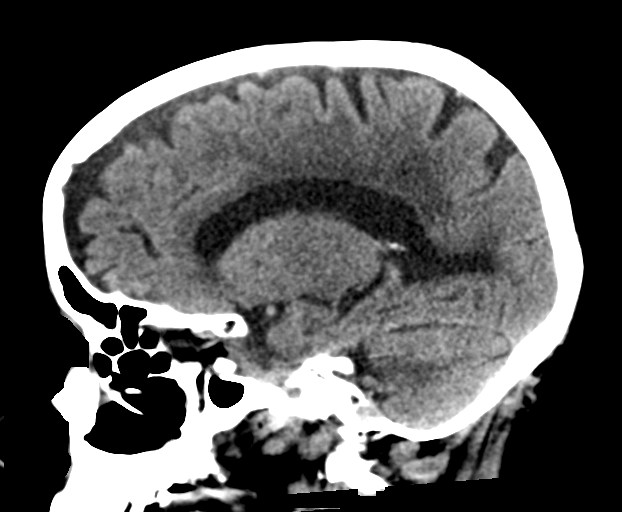

[17 of 47 positions shown; findings below may reference images not displayed]

FINDINGS: Brain: Atrophic and chronic white matter ischemic changes are noted.
No findings to suggest acute hemorrhage, acute infarction or
space-occupying mass lesion are noted.

Vascular: No hyperdense vessel or unexpected calcification.

Skull: Normal. Negative for fracture or focal lesion.

Sinuses/Orbits: Mild mucosal thickening is noted within the right
maxillary antrum.

Other: None.
IMPRESSION: Chronic atrophic and ischemic changes without acute abnormality.

## 2021-05-21 IMAGING — DX DG CHEST 1V PORT
1 series · 1 of 1 positions shown · non-contrast
Comparison: Chest x-ray 05/05/2019.

CLINICAL DATA: Shortness of breath.

EXAM:
PORTABLE CHEST 1 VIEW

[chest]
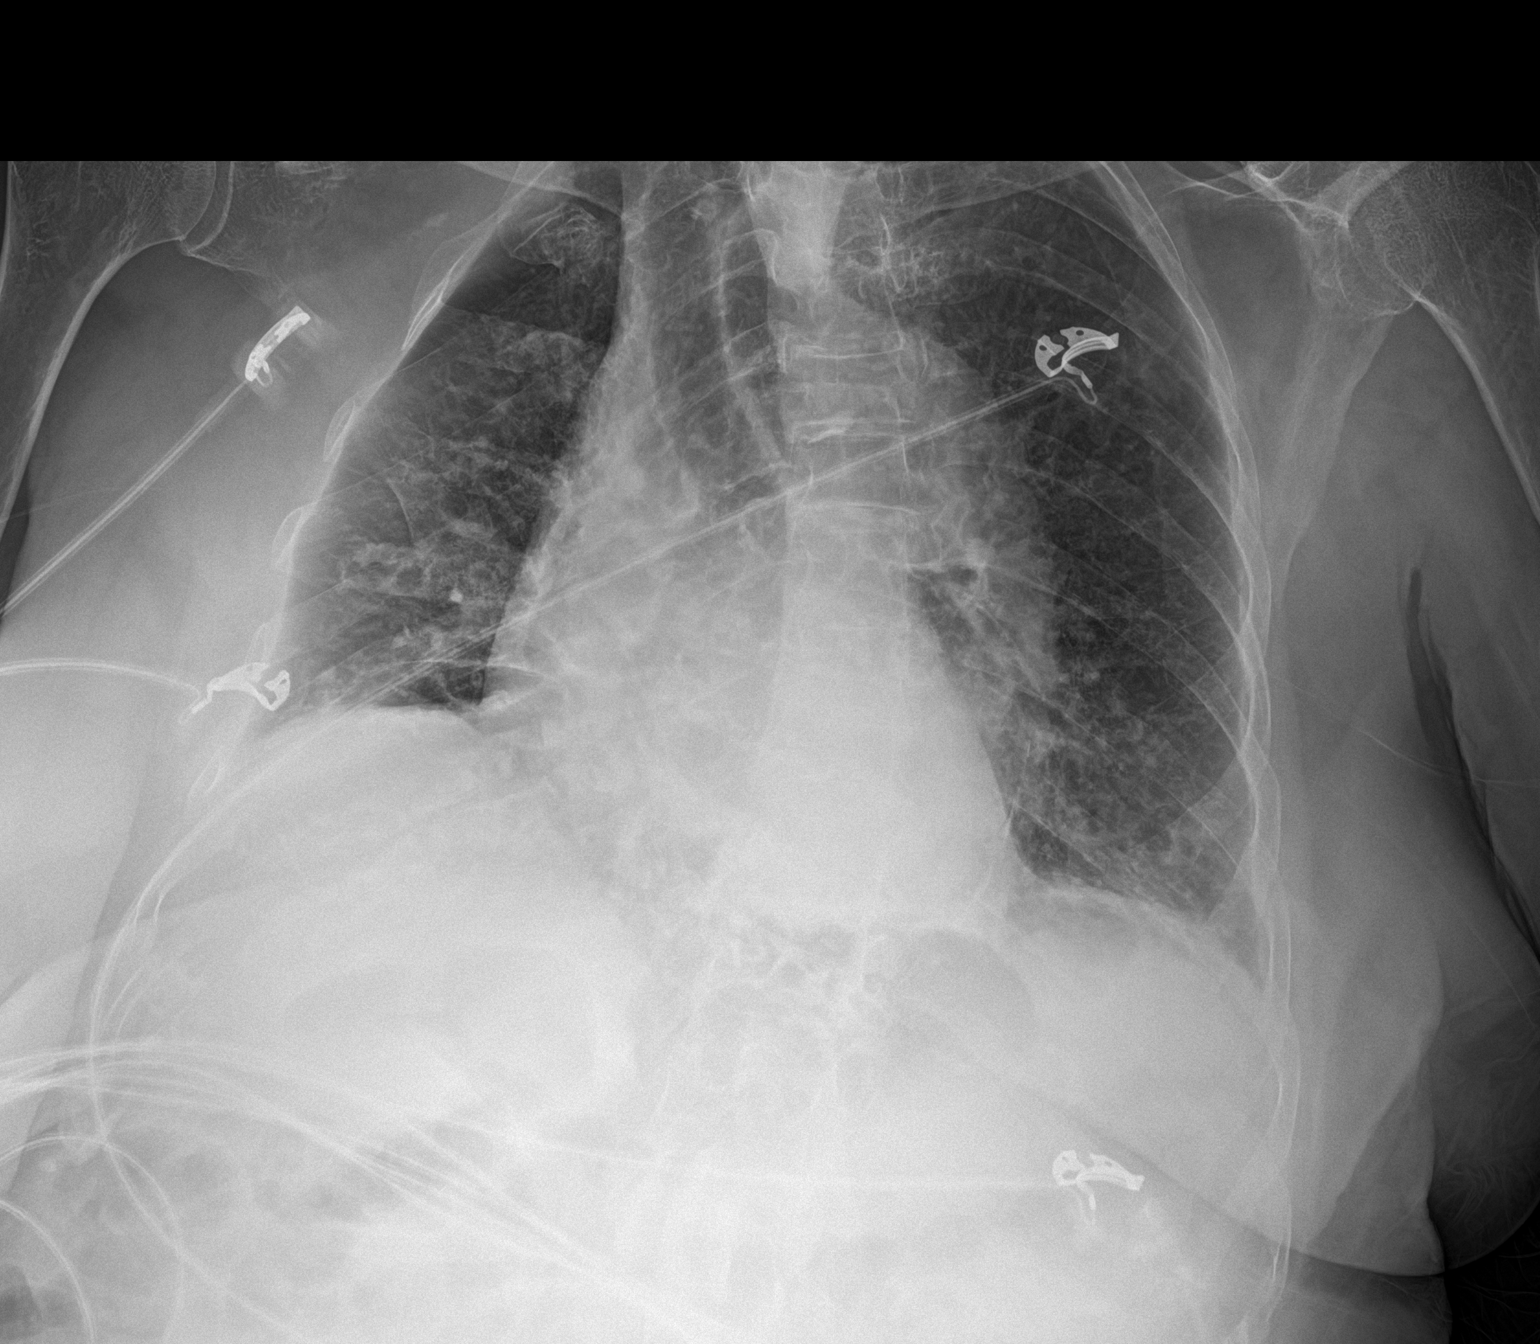

[1 of 1 positions shown; findings below may reference images not displayed]

FINDINGS: Patient rotated to the right. Trachea shifted to the right, this may
be secondary to prominent thoracic aorta and patient rotation. No
interim change. Heart size stable. Mild bibasilar
atelectasis/infiltrates. No pleural effusion or pneumothorax.
IMPRESSION: Mild bibasilar atelectasis/infiltrates.
